# Patient Record
Sex: Male | Born: 1955 | Race: Asian | Hispanic: No | Marital: Married | Smoking: Former smoker
Health system: Southern US, Community
[De-identification: ages and names within clinical notes are randomized; demographics above are authoritative.]

## PROBLEM LIST (undated history)

## (undated) DIAGNOSIS — I10 Essential (primary) hypertension: Secondary | ICD-10-CM

## (undated) DIAGNOSIS — H9209 Otalgia, unspecified ear: Secondary | ICD-10-CM

## (undated) HISTORY — DX: Essential (primary) hypertension: I10

## (undated) HISTORY — DX: Otalgia, unspecified ear: H92.09

## (undated) HISTORY — PX: NO PAST SURGERIES: SHX2092

---

## 2015-04-17 ENCOUNTER — Ambulatory Visit (INDEPENDENT_AMBULATORY_CARE_PROVIDER_SITE_OTHER): Payer: BLUE CROSS/BLUE SHIELD | Admitting: Family Medicine

## 2015-04-17 ENCOUNTER — Encounter: Payer: Self-pay | Admitting: Family Medicine

## 2015-04-17 VITALS — BP 100/74 | HR 88 | Temp 97.7°F | Ht 65.0 in | Wt 160.6 lb

## 2015-04-17 DIAGNOSIS — Z7689 Persons encountering health services in other specified circumstances: Secondary | ICD-10-CM

## 2015-04-17 DIAGNOSIS — Z7189 Other specified counseling: Secondary | ICD-10-CM

## 2015-04-17 DIAGNOSIS — Z13220 Encounter for screening for lipoid disorders: Secondary | ICD-10-CM | POA: Diagnosis not present

## 2015-04-17 DIAGNOSIS — I10 Essential (primary) hypertension: Secondary | ICD-10-CM | POA: Diagnosis not present

## 2015-04-17 DIAGNOSIS — Z1211 Encounter for screening for malignant neoplasm of colon: Secondary | ICD-10-CM | POA: Diagnosis not present

## 2015-04-17 LAB — LIPID PANEL
CHOLESTEROL: 167 mg/dL (ref 0–200)
HDL: 42.7 mg/dL (ref >39.00)
LDL Cholesterol: 103 mg/dL — ABNORMAL HIGH (ref 0–99)
NONHDL: 124.45
Total CHOL/HDL Ratio: 4
Triglycerides: 106 mg/dL (ref 0.0–149.0)
VLDL: 21.2 mg/dL (ref 0.0–40.0)

## 2015-04-17 LAB — BASIC METABOLIC PANEL
BUN: 17 mg/dL (ref 6–23)
CO2: 32 mEq/L (ref 19–32)
Calcium: 9.7 mg/dL (ref 8.4–10.5)
Chloride: 103 mEq/L (ref 96–112)
Creatinine, Ser: 0.85 mg/dL (ref 0.40–1.50)
GFR: 98.38 mL/min (ref >60.00)
GLUCOSE: 79 mg/dL (ref 70–99)
Potassium: 4.1 mEq/L (ref 3.5–5.1)
SODIUM: 140 meq/L (ref 135–145)

## 2015-04-17 NOTE — Progress Notes (Signed)
HPI:  Joseph Wong is here to establish care. His wife come to this practice. Want to transfer here as is more convenient. Speaks [place]. Interpreter present.  Has the following chronic problems that require follow up and concerns today:  HTN: -chronic -meds: olmesartan-hctz 40-25 (1/2 tablet) daily - this is expensive for him but he prefers to continue with it -Denies: CP, SOB, DOE, HA, vision changes  Colon cancer screening: did colonoscopy in [place] 5 years ago and was told to repeat in  5 Years. He wants referral for this.  ROS negative for unless reported above: fevers, unintentional weight loss, hearing or vision loss, chest pain, palpitations, struggling to breath, hemoptysis, melena, hematochezia, hematuria, falls, loc, si, thoughts of self harm  Past Medical History  Diagnosis Date  . Hypertension   . Ear pain     sees ENT    History reviewed. No pertinent past surgical history.  Family History  Problem Relation Age of Onset  . Heart disease Father 92    MI  . Heart disease Brother     vulvular disease  . Hypertension Brother     History   Social History  . Marital Status: Married    Spouse Name: N/A  . Number of Children: N/A  . Years of Education: N/A   Social History Main Topics  . Smoking status: Former Games developer  . Smokeless tobacco: Not on file     Comment: 36-43 yo, 1ppd  . Alcohol Use: 0.0 oz/week    0 Standard drinks or equivalent per week     Comment: wine 2-3 glasses, twice per week  . Drug Use: No  . Sexual Activity: Not on file   Other Topics Concern  . None   Social History Narrative   Work or School: full Games developer Situation: lives with wife whom also sees me      Spiritual Beliefs: Christian      Lifestyle: goes to the gym 4x per week; trying to ear more veggies limiting           Current outpatient prescriptions:  .  olmesartan-hydrochlorothiazide (BENICAR HCT) 40-25 MG per tablet, Take 1 tablet by mouth daily.,  Disp: , Rfl:   EXAM:  Filed Vitals:   04/17/15 1131  BP: 100/74  Pulse: 88  Temp: 97.7 F (36.5 C)    Body mass index is 26.73 kg/(m^2).  GENERAL: vitals reviewed and listed above, alert, oriented, appears well hydrated and in no acute distress  HEENT: atraumatic, conjunttiva clear, no obvious abnormalities on inspection of external nose and ears  NECK: no obvious masses on inspection  LUNGS: clear to auscultation bilaterally, no wheezes, rales or rhonchi, good air movement  CV: HRRR, no peripheral edema  MS: moves all extremities without noticeable abnormality  PSYCH: pleasant and cooperative, no obvious depression or anxiety  ASSESSMENT AND PLAN:  > 30 minutes spent with this patient with > 50% face to face on counsleing: Discussed the following assessment and plan:  Essential hypertension - Plan: Basic metabolic panel  Screening for colon cancer - Plan: Ambulatory referral to Gastroenterology  Encounter to establish care  Screening for hyperlipidemia - Plan: Lipid Panel  -advised healthy lifestyle -risks of alcohol and safe drinking levels discussed -discussed options for his BP - many that would likely be cheaper, but he opted to continue current medication for no, advised to call if wants to change -We reviewed the PMH, PSH, FH, SH, Meds and  Allergies. -We provided refills for any medications we will prescribe as needed. -We addressed current concerns per orders and patient instructions. -We have asked for records for pertinent exams, studies, vaccines and notes from previous providers. -We have advised patient to follow up per instructions below.  -Patient advised to return or notify a doctor immediately if symptoms worsen or persist or new concerns arise.  Patient Instructions  BEFORE YOU LEAVE: -labs -schedule physical exam in 6 months  -We placed a referral for you as discussed to the gastroenterologist for your colonoscopy. It usually takes about 1-2  weeks to process and schedule this referral. If you have not heard from [place] regarding this appointment in 2 weeks please contact our office.  -We have ordered labs or studies at this visit. It can take up to 1-2 weeks for results and processing. We will contact you with instructions IF your results are abnormal. Normal results will be released to your [facility]. If you have not heard from [place] or can not find your results in [facility] in 2 weeks please contact our office.  We recommend the following healthy lifestyle measures: - eat a healthy diet consisting of lots of vegetables, fruits, beans, nuts, seeds, healthy meats such as white chicken and fish and whole grains.  - avoid fried foods, fast food, processed foods, sodas, red meet and other fattening foods.  - get a least 150 minutes of aerobic exercise per week.            Kriste Basque R.

## 2015-04-17 NOTE — Progress Notes (Signed)
Pre visit review using our clinic review tool, if applicable. No additional management support is needed unless otherwise documented below in the visit note. 

## 2015-04-17 NOTE — Patient Instructions (Signed)
BEFORE YOU LEAVE: -labs -schedule physical exam in 6 months  -We placed a referral for you as discussed to the gastroenterologist for your colonoscopy. It usually takes about 1-2 weeks to process and schedule this referral. If you have not heard from [place] regarding this appointment in 2 weeks please contact our office.  -We have ordered labs or studies at this visit. It can take up to 1-2 weeks for results and processing. We will contact you with instructions IF your results are abnormal. Normal results will be released to your [facility]. If you have not heard from [place] or can not find your results in [facility] in 2 weeks please contact our office.  We recommend the following healthy lifestyle measures: - eat a healthy diet consisting of lots of vegetables, fruits, beans, nuts, seeds, healthy meats such as white chicken and fish and whole grains.  - avoid fried foods, fast food, processed foods, sodas, red meet and other fattening foods.  - get a least 150 minutes of aerobic exercise per week.

## 2015-05-12 ENCOUNTER — Ambulatory Visit: Payer: Self-pay | Admitting: Family Medicine

## 2015-10-12 ENCOUNTER — Encounter: Payer: Self-pay | Admitting: Family Medicine

## 2015-10-13 ENCOUNTER — Encounter: Payer: Self-pay | Admitting: Family Medicine

## 2016-07-04 ENCOUNTER — Encounter: Payer: Self-pay | Admitting: *Deleted

## 2016-10-03 NOTE — Progress Notes (Signed)
HPI:  Here for CPE:  -Concerns and/or follow up today: none [place] language, interpreter present. Seen once in 2016, has not returned since. Hx HTN on acei-diuretic combo and mild HLD. Taking half pill. Needs refill.  -Diet: variety of foods, balance and well rounded, larger portion sizes - lots of rice  -Exercise: no regular exercise  -Diabetes and Dyslipidemia Screening:FASTING for labs  -Hx of HTN: no  -Vaccines: UTD  -sexual activity: yes, male partner, no new partners  -wants STI testing, Hep C screening (if born [id number]): desires hep c screening  -FH colon or prstate ca: see FH Last colon cancer screening: at last visit [place]x [place] with 5 year repeat advised. He requested referral and this was placed, but then when they contacted him to schedule he declined per phone note review.  [place] and [place] in [place]. Does not have report.  Last prostate ca screening: discussed limitation/risks/benefits/indication for DRE and/or PSA per current guidelines. He opted for PSA.  -Alcohol, Tobacco, drug use: see social history  Review of Systems - no fevers, unintentional weight loss, vision loss, hearing loss, chest pain, sob, hemoptysis, melena, hematochezia, hematuria, genital discharge, changing or concerning skin lesions, bleeding, bruising, loc, thoughts of self harm or SI  Past Medical History:  Diagnosis Date  . Ear pain    sees ENT  . Hypertension     No past surgical history on file.  Family History  Problem Relation Age of Onset  . Heart disease Father [id number]    MI  . Heart disease Brother     vulvular disease  . Hypertension Brother     Social History   Social History  . Marital status: Married    Spouse name: N/A  . Number of children: N/A  . Years of education: N/A   Social History Main Topics  . Smoking status: Former Games developermoker  . Smokeless tobacco: None     Comment: 896-43 yo,  1ppd  . Alcohol use 0.0 oz/week     Comment: wine 2-3 glasses, twice per week  . Drug use: No  . Sexual activity: Not Asked   Other Topics Concern  . None   Social History Narrative   Work or School: full Games developertime retail      Home Situation: lives with wife whom also sees me      Spiritual Beliefs: Christian      Lifestyle: goes to the gym 4x per week; trying to ear more veggies limiting           Current Outpatient Prescriptions:  .  olmesartan-hydrochlorothiazide (BENICAR HCT) 40-25 MG tablet, Take 0.5 tablets by mouth daily., Disp: 45 tablet, Rfl: 3  EXAM:  Vitals:   10/04/16 1104  BP: 104/80  Pulse: 80  Temp: 98 F (36.7 C)  TempSrc: Oral  Weight: 169 lb 9.6 oz (76.9 kg)  Height: 5' 5.25" (1.657 m)    Estimated body mass index is 28.01 kg/m as calculated from the following:   Height as of this encounter: 5' 5.25" (1.657 m).   Weight as of this encounter: 169 lb 9.6 oz (76.9 kg).  GENERAL: vitals reviewed and listed below, alert, oriented, appears well hydrated and in no acute distress  HEENT: head atraumatic, PERRLA, normal appearance of eyes, ears, nose and mouth. moist mucus membranes.  NECK: supple, no masses or lymphadenopathy  LUNGS: clear to auscultation bilaterally, no rales, rhonchi or wheeze  CV: HRRR, no  peripheral edema or cyanosis, normal pedal pulses  ABDOMEN: bowel sounds normal, soft, non tender to palpation, no masses, no rebound or guarding  GU: normal appearance of external genitalia - no lesions or masses, hernia exam normal.  RECTAL: refused  SKIN: no rash or abnormal lesions  MS: normal gait, moves all extremities normally  NEURO: normal gait, speech and thought processing grossly intact, muscle tone grossly intact throughout  PSYCH: normal affect, pleasant and cooperative  ASSESSMENT AND PLAN:  Discussed the following assessment and plan:  There are no diagnoses linked to this encounter.  -Discussed and advised all [place]  preventive services health task force level A and B recommendations for age, sex and risks.  --Advised at least 150 minutes of exercise per week and a healthy diet with avoidance of (less then 1 serving per week) processed foods, white starches, red meat, fast foods and sweets and consisting of: * 5-9 servings of fresh fruits and vegetables (not corn or potatoes) *nuts and seeds, beans *olives and olive oil *lean meats such as fish and white chicken  *whole grains  -labs, studies and vaccines per orders this encounter   Patient advised to return to clinic immediately if symptoms worsen or persist or new concerns.  Patient Instructions  BEFORE YOU LEAVE: -labs -follow up: 3-4 months  We have ordered labs or studies at this visit. It can take up to 1-2 weeks for results and processing. IF results require follow up or explanation, we will call you with instructions. Clinically stable results will be released to your [facility]. If you have not heard from [place] or cannot find your results in [facility] in 2 weeks please contact our office at [phone].  If you are not yet signed up for [facility], please consider signing up.  Please bring a copy of your prior colonoscopy reports to our office.  We recommend the following healthy lifestyle for LIFE: 1) Small portions.   Tip: eat off of a salad plate instead of a dinner plate..  Tip: if you need more or a snack choose fruits, veggies and/or a handful of nuts or seeds.  2) Eat a healthy clean diet.  * Tip: Avoid (less then 1 serving per week): processed foods, sweets, sweetened drinks, white starches (rice, flour, bread, potatoes, pasta, etc), red meat, fast foods, butter  *Tip: CHOOSE instead   * 5-9 servings per day of fresh or frozen fruits and vegetables (but not corn, potatoes, bananas, canned or dried fruit)   *nuts and seeds, beans   *olives and olive oil   *small portions of lean meats such as fish and white chicken    *small portions of  whole grains  3)Get at least 150 minutes of sweaty aerobic exercise per week.  4)Reduce stress - consider counseling, meditation and relaxation to balance other aspects of your life.            No Follow-up on file.   Kriste Basque R., DO

## 2016-10-04 ENCOUNTER — Ambulatory Visit (INDEPENDENT_AMBULATORY_CARE_PROVIDER_SITE_OTHER): Payer: BLUE CROSS/BLUE SHIELD | Admitting: Family Medicine

## 2016-10-04 ENCOUNTER — Encounter: Payer: Self-pay | Admitting: Family Medicine

## 2016-10-04 VITALS — BP 104/80 | HR 80 | Temp 98.0°F | Ht 65.25 in | Wt 169.6 lb

## 2016-10-04 DIAGNOSIS — Z0000 Encounter for general adult medical examination without abnormal findings: Secondary | ICD-10-CM

## 2016-10-04 DIAGNOSIS — Z6828 Body mass index (BMI) 28.0-28.9, adult: Secondary | ICD-10-CM

## 2016-10-04 DIAGNOSIS — I10 Essential (primary) hypertension: Secondary | ICD-10-CM

## 2016-10-04 LAB — BASIC METABOLIC PANEL
BUN: 21 mg/dL (ref 6–23)
CALCIUM: 9.5 mg/dL (ref 8.4–10.5)
CO2: 26 mEq/L (ref 19–32)
CREATININE: 0.78 mg/dL (ref 0.40–1.50)
Chloride: 106 mEq/L (ref 96–112)
GFR: 108.09 mL/min (ref >60.00)
Glucose, Bld: 89 mg/dL (ref 70–99)
Potassium: 3.7 mEq/L (ref 3.5–5.1)
Sodium: 140 mEq/L (ref 135–145)

## 2016-10-04 LAB — LIPID PANEL
CHOLESTEROL: 184 mg/dL (ref 0–200)
HDL: 47.3 mg/dL (ref >39.00)
LDL Cholesterol: 126 mg/dL — ABNORMAL HIGH (ref 0–99)
NONHDL: 137.14
Total CHOL/HDL Ratio: 4
Triglycerides: 56 mg/dL (ref 0.0–149.0)
VLDL: 11.2 mg/dL (ref 0.0–40.0)

## 2016-10-04 LAB — CBC
HCT: 44.7 % (ref 39.0–52.0)
HEMOGLOBIN: 15.6 g/dL (ref 13.0–17.0)
MCHC: 34.8 g/dL (ref 30.0–36.0)
MCV: 85.6 fl (ref 78.0–100.0)
PLATELETS: 285 10*3/uL (ref 150.0–400.0)
RBC: 5.22 Mil/uL (ref 4.22–5.81)
RDW: 13.3 % (ref 11.5–15.5)
WBC: 5.8 10*3/uL (ref 4.0–10.5)

## 2016-10-04 LAB — PSA: PSA: 0.38 ng/mL (ref 0.10–4.00)

## 2016-10-04 LAB — HEMOGLOBIN A1C: Hgb A1c MFr Bld: 5.3 % (ref 4.6–6.5)

## 2016-10-04 MED ORDER — OLMESARTAN MEDOXOMIL-HCTZ 40-25 MG PO TABS: 0.5000 | ORAL_TABLET | Freq: Every day | ORAL | 3 refills | Status: DC

## 2016-10-04 NOTE — Progress Notes (Signed)
Pre visit review using our clinic review tool, if applicable. No additional management support is needed unless otherwise documented below in the visit note. 

## 2016-10-04 NOTE — Patient Instructions (Addendum)
BEFORE YOU LEAVE: -labs -follow up: 3-4 months  We have ordered labs or studies at this visit. It can take up to 1-2 weeks for results and processing. IF results require follow up or explanation, we will call you with instructions. Clinically stable results will be released to your [facility]. If you have not heard from us or cannot find your results in [facility] in 2 weeks please contact our office at [phone].  If you are not yet signed up for [facility], please consider signing up.  Please bring a copy of your prior colonoscopy reports to our office.  We recommend the following healthy lifestyle for LIFE: 1) Small portions.   Tip: eat off of a salad plate instead of a dinner plate..  Tip: if you need more or a snack choose fruits, veggies and/or a handful of nuts or seeds.  2) Eat a healthy clean diet.  * Tip: Avoid (less then 1 serving per week): processed foods, sweets, sweetened drinks, white starches (rice, flour, bread, potatoes, pasta, etc), red meat, fast foods, butter  *Tip: CHOOSE instead   * 5-9 servings per day of fresh or frozen fruits and vegetables (but not corn, potatoes, bananas, canned or dried fruit)   *nuts and seeds, beans   *olives and olive oil   *small portions of lean meats such as fish and white chicken    *small portions of whole grains  3)Get at least 150 minutes of sweaty aerobic exercise per week.  4)Reduce stress - consider counseling, meditation and relaxation to balance other aspects of your life.

## 2016-10-05 LAB — HEPATITIS C ANTIBODY: HCV Ab: NEGATIVE

## 2017-01-16 ENCOUNTER — Ambulatory Visit (INDEPENDENT_AMBULATORY_CARE_PROVIDER_SITE_OTHER): Payer: BLUE CROSS/BLUE SHIELD | Admitting: Family Medicine

## 2017-01-16 ENCOUNTER — Encounter: Payer: Self-pay | Admitting: Family Medicine

## 2017-01-16 VITALS — BP 120/90 | HR 79 | Temp 98.3°F | Ht 65.25 in | Wt 165.2 lb

## 2017-01-16 DIAGNOSIS — I10 Essential (primary) hypertension: Secondary | ICD-10-CM | POA: Diagnosis not present

## 2017-01-16 DIAGNOSIS — E785 Hyperlipidemia, unspecified: Secondary | ICD-10-CM | POA: Diagnosis not present

## 2017-01-16 DIAGNOSIS — M5442 Lumbago with sciatica, left side: Secondary | ICD-10-CM | POA: Diagnosis not present

## 2017-01-16 LAB — BASIC METABOLIC PANEL
BUN: 21 mg/dL (ref 6–23)
CALCIUM: 9.2 mg/dL (ref 8.4–10.5)
CHLORIDE: 103 meq/L (ref 96–112)
CO2: 27 mEq/L (ref 19–32)
Creatinine, Ser: 0.83 mg/dL (ref 0.40–1.50)
GFR: 100.51 mL/min (ref >60.00)
Glucose, Bld: 85 mg/dL (ref 70–99)
Potassium: 3.3 mEq/L — ABNORMAL LOW (ref 3.5–5.1)
SODIUM: 138 meq/L (ref 135–145)

## 2017-01-16 LAB — CBC
HCT: 42.2 % (ref 39.0–52.0)
Hemoglobin: 14.5 g/dL (ref 13.0–17.0)
MCHC: 34.3 g/dL (ref 30.0–36.0)
MCV: 88 fl (ref 78.0–100.0)
Platelets: 245 10*3/uL (ref 150.0–400.0)
RBC: 4.79 Mil/uL (ref 4.22–5.81)
RDW: 13.2 % (ref 11.5–15.5)
WBC: 6.2 10*3/uL (ref 4.0–10.5)

## 2017-01-16 MED ORDER — CYCLOBENZAPRINE HCL 5 MG PO TABS: 5.0000 mg | ORAL_TABLET | Freq: Every evening | ORAL | 0 refills | Status: DC | PRN

## 2017-01-16 MED ORDER — OLMESARTAN MEDOXOMIL-HCTZ 40-25 MG PO TABS: 1.0000 | ORAL_TABLET | Freq: Every day | ORAL | 3 refills | Status: DC

## 2017-01-16 NOTE — Patient Instructions (Addendum)
BEFORE YOU LEAVE: -HEP low back -labs -follow up: 1 month  Increase the blood pressure medication (benicar) to 1 tablet daily.  Do the back exercises 4 days per week.  Muscle relaxer at night as needed.  Can use heat, topical menthol (tiger balm) and tylenol per instructions if needed for pain.  We have ordered labs or studies at this visit. It can take up to 1-2 weeks for results and processing. IF results require follow up or explanation, we will call you with instructions. Clinically stable results will be released to your [facility]. If you have not heard from [place] or cannot find your results in [facility] in 2 weeks please contact our office at [phone].  If you are not yet signed up for [facility], please consider signing up.   We recommend the following healthy lifestyle for LIFE: 1) Small portions.   Tip: eat off of a salad plate instead of a dinner plate.  Tip: if you need more or a snack choose fruits, veggies and/or a handful of nuts or seeds.  2) Eat a healthy clean diet.  * Tip: Avoid (less then 1 serving per week): processed foods, sweets, sweetened drinks, white starches (rice, flour, bread, potatoes, pasta, etc), red meat, fast foods, butter  *Tip: CHOOSE instead   * 5-9 servings per day of fresh or frozen fruits and vegetables (but not corn, potatoes, bananas, canned or dried fruit)   *nuts and seeds, beans   *olives and olive oil   *small portions of lean meats such as fish and white chicken    *small portions of whole grains  3)Get at least 150 minutes of sweaty aerobic exercise per week.  4)Reduce stress - consider counseling, meditation and relaxation to balance other aspects of your life.

## 2017-01-16 NOTE — Progress Notes (Signed)
Pre visit review using our clinic review tool, if applicable. No additional management support is needed unless otherwise documented below in the visit note. 

## 2017-01-16 NOTE — Progress Notes (Signed)
HPI:  Follow up: Due for labs.  New concern Low back pain, -bilat, intermittent for > 14 years -most recent flare 1 month ago and had bilat low back pain, with some radiation pain to L post leg -he saw chiropractor and now is doing much better but still has some bilat low back pain in muscles -at this point denies: radiation, weakness, numbness, bowel or bladder incontinence, fevers, malaise  HTN: -meds: olmesartan-hctz 1/2 tab daily -running high at home  HLD: -mild, lifestyle recs advised -swimming, diet so so  ROS: See pertinent positives and negatives per HPI.  Past Medical History:  Diagnosis Date  . Ear pain    sees ENT  . Hypertension     No past surgical history on file.  Family History  Problem Relation Age of Onset  . Heart disease Father 63    MI  . Heart disease Brother     vulvular disease  . Hypertension Brother     Social History   Social History  . Marital status: Married    Spouse name: N/A  . Number of children: N/A  . Years of education: N/A   Social History Main Topics  . Smoking status: Former Games developer  . Smokeless tobacco: Never Used     Comment: 45-43 yo, 1ppd  . Alcohol use 0.0 oz/week     Comment: wine 2-3 glasses, twice per week  . Drug use: No  . Sexual activity: Not Asked   Other Topics Concern  . 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  olmesartan-hydrochlorothiazide (BENICAR HCT) 40-25 MG tablet, Take 1 tablet by mouth daily., Disp: 90 tablet, Rfl: 3 .  cyclobenzaprine (FLEXERIL) 5 MG tablet, Take 1 tablet (5 mg total) by mouth at bedtime as needed for muscle spasms., Disp: 20 tablet, Rfl: 0  EXAM:  Vitals:   01/16/17 1259 01/16/17 1302  BP: 100/80 120/90  Pulse: 79    Temp: 98.3 F (36.8 C)     Body mass index is 27.28 kg/m.  GENERAL: vitals reviewed and listed above, alert, oriented, appears well hydrated and in no acute distress  HEENT: atraumatic, conjunttiva clear, no obvious abnormalities on inspection of external nose and ears  NECK: no obvious masses on inspection  LUNGS: clear to auscultation bilaterally, no wheezes, rales or rhonchi, good air movement  CV: HRRR, no peripheral edema  MS: moves all extremities without noticeable abnormality Normal Gait Normal inspection of back, no obvious scoliosis or leg length descrepancy No bony TTP Soft tissue TTP at: -/+ tests: neg trendelenburg,-facet loading, -SLRT, -CLRT, -FABER, -FADIR Normal muscle strength, sensation to light touch and DTRs in LEs bilaterally  PSYCH: pleasant and cooperative, no obvious depression or anxiety  ASSESSMENT AND PLAN:  Discussed the following assessment and plan:  Essential hypertension - Plan: Basic metabolic panel, CBC  Hyperlipidemia, unspecified hyperlipidemia type  Left-sided low back pain with left-sided sciatica, unspecified chronicity  -labs -increase BP med -etiologies, eval, tx back pain discussed - opted for HEP, muscle relaxer, close follow up and per pt instructions -follow up 1 month -Patient advised to return or notify a doctor immediately if symptoms  worsen or persist or new concerns arise.  Patient Instructions  BEFORE YOU LEAVE: -HEP low back -labs -follow up: 1 month  Increase the blood pressure medication (benicar) to 1 tablet daily.  Do the back exercises 4 days per week.  Muscle relaxer at night as needed.  Can use heat, topical menthol (tiger balm) and tylenol per instructions if needed for pain.  We have ordered labs or studies at this visit. It can take up to 1-2 weeks for results and processing. IF results require follow up or explanation, we will call you with instructions. Clinically stable results will be  released to your [facility]. If you have not heard from [place] or cannot find your results in [facility] in 2 weeks please contact our office at [phone].  If you are not yet signed up for [facility], please consider signing up.   We recommend the following healthy lifestyle for LIFE: 1) Small portions.   Tip: eat off of a salad plate instead of a dinner plate.  Tip: if you need more or a snack choose fruits, veggies and/or a handful of nuts or seeds.  2) Eat a healthy clean diet.  * Tip: Avoid (less then 1 serving per week): processed foods, sweets, sweetened drinks, white starches (rice, flour, bread, potatoes, pasta, etc), red meat, fast foods, butter  *Tip: CHOOSE instead   * 5-9 servings per day of fresh or frozen fruits and vegetables (but not corn, potatoes, bananas, canned or dried fruit)   *nuts and seeds, beans   *olives and olive oil   *small portions of lean meats such as fish and white chicken    *small portions of whole grains  3)Get at least 150 minutes of sweaty aerobic exercise per week.  4)Reduce stress - consider counseling, meditation and relaxation to balance other aspects of your life.         Kriste Basque R., DO

## 2017-01-18 ENCOUNTER — Other Ambulatory Visit: Payer: Self-pay | Admitting: *Deleted

## 2017-01-18 DIAGNOSIS — E876 Hypokalemia: Secondary | ICD-10-CM

## 2017-02-06 ENCOUNTER — Ambulatory Visit: Payer: BLUE CROSS/BLUE SHIELD | Admitting: Family Medicine

## 2017-03-06 NOTE — Progress Notes (Signed)
HPI:  Follow up:   HTN: -meds: olmesartan-hctz increased last visit -reports tolerating new dose well -mild hypokalemia last check -no cp, sob, doe, ha  HLD: -mild, lifestyle recs advised -swimming, diet so so -mildly elevated last check and he inquires about labs  Flare chronic low back pain: -treated with HEP, muscle relaxer and 1 month f/u advised last visit -reports almost complete resolution symptoms  ROS: See pertinent positives and negatives per HPI.  Past Medical History:  Diagnosis Date  . Ear pain    sees ENT  . Hypertension     No past surgical history on file.  Family History  Problem Relation Age of Onset  . Heart disease Father 2167       MI  . Heart disease Brother        vulvular disease  . Hypertension Brother     Social History   Social History  . Marital status: Married    Spouse name: N/A  . Number of children: N/A  . Years of education: N/A   Social History Main Topics  . Smoking status: Former Games developermoker  . Smokeless tobacco: Never Used     Comment: 5726-61 yo, 1ppd  . Alcohol use 0.0 oz/week     Comment: wine 2-3 glasses, twice per week, twice per week  . Drug use: No  . Sexual activity: Not Asked   Other Topics Concern  . None   Social History Narrative   Work or School: full Games developertime retail      Home Situation: lives with wife whom also sees me      Spiritual Beliefs: Christian      Lifestyle: goes to the gym 4x per week; trying to ear more veggies limiting           Current Outpatient Prescriptions:  .  olmesartan-hydrochlorothiazide (BENICAR HCT) 40-25 MG tablet, Take 1 tablet by mouth daily., Disp: 90 tablet, Rfl: 3 .  cyclobenzaprine (FLEXERIL) 5 MG tablet, Take 1 tablet (5 mg total) by mouth at bedtime as needed for muscle spasms. (Patient not taking: Reported on 03/07/2017), Disp: 20 tablet, Rfl: 0  EXAM:  Vitals:   03/07/17 1012  BP: 108/70  Pulse: 72  Temp: 98.1 F (36.7 C)    Body mass index is 27.69 kg/m.  GENERAL:  vitals reviewed and listed above, alert, oriented, appears well hydrated and in no acute distress  HEENT: atraumatic, conjunttiva clear, no obvious abnormalities on inspection of external nose and ears  NECK: no obvious masses on inspection  LUNGS: clear to auscultation bilaterally, no wheezes, rales or rhonchi, good air movement  CV: HRRR, no peripheral edema  MS: moves all extremities without noticeable abnormality  PSYCH: pleasant and cooperative, no obvious depression or anxiety  ASSESSMENT AND PLAN:  Discussed the following assessment and plan:  Essential hypertension  Hypokalemia  Hyperlipidemia, unspecified hyperlipidemia type  Acute low back pain, unspecified back pain laterality, with sciatica presence unspecified  Low blood potassium - Plan: Basic metabolic panel  -check BMP  -lifestyle recs -continue HEP to keep back strong -printed and explanation labs -Patient advised to return or notify a doctor immediately if symptoms worsen or persist or new concerns arise.  Patient Instructions  BEFORE YOU LEAVE: -lab to recheck potassium -please print out last BMP, last hgba1c and last cholesterol results for patient - he is not due for any other labs this visit but will plan to check blood pressure labs next visit. Cholesterol and diabetes screening labs are recommended every 1-3 years  depending on results. -follow up: 3-4 months  Continue the back exercises 4 days per week. I am so glad you are feeling better.  Advise regular aerobic exercise (at least 150 minutes per week of sweaty exercise) and a healthy diet. Try to eat at least 5-9 servings of vegetables and fruits per day (not corn, potatoes or bananas.) Avoid sweets, red meat, pork, butter, fried foods, fast food, processed food, excessive dairy, eggs and coconut. Replace bad fats with good fats - fish, nuts and seeds, canola oil, olive oil.      Kriste Basque R., DO

## 2017-03-07 ENCOUNTER — Ambulatory Visit (INDEPENDENT_AMBULATORY_CARE_PROVIDER_SITE_OTHER): Payer: BLUE CROSS/BLUE SHIELD | Admitting: Family Medicine

## 2017-03-07 ENCOUNTER — Encounter: Payer: Self-pay | Admitting: Family Medicine

## 2017-03-07 VITALS — BP 108/70 | HR 72 | Temp 98.1°F | Wt 167.7 lb

## 2017-03-07 DIAGNOSIS — I10 Essential (primary) hypertension: Secondary | ICD-10-CM | POA: Diagnosis not present

## 2017-03-07 DIAGNOSIS — E785 Hyperlipidemia, unspecified: Secondary | ICD-10-CM

## 2017-03-07 DIAGNOSIS — M545 Low back pain, unspecified: Secondary | ICD-10-CM | POA: Insufficient documentation

## 2017-03-07 DIAGNOSIS — E876 Hypokalemia: Secondary | ICD-10-CM | POA: Diagnosis not present

## 2017-03-07 LAB — BASIC METABOLIC PANEL
BUN: 23 mg/dL (ref 6–23)
CALCIUM: 9.5 mg/dL (ref 8.4–10.5)
CO2: 26 meq/L (ref 19–32)
CREATININE: 0.72 mg/dL (ref 0.40–1.50)
Chloride: 105 mEq/L (ref 96–112)
GFR: 118.38 mL/min (ref >60.00)
Glucose, Bld: 99 mg/dL (ref 70–99)
Potassium: 3.6 mEq/L (ref 3.5–5.1)
Sodium: 138 mEq/L (ref 135–145)

## 2017-03-07 NOTE — Patient Instructions (Addendum)
BEFORE YOU LEAVE: -lab to recheck potassium -please print out last BMP, last hgba1c and last cholesterol results for patient - he is not due for any other labs this visit but will plan to check blood pressure labs next visit. Cholesterol and diabetes screening labs are recommended every 1-3 years depending on results. -follow up: 3-4 months  Continue the back exercises 4 days per week. I am so glad you are feeling better.  Advise regular aerobic exercise (at least 150 minutes per week of sweaty exercise) and a healthy diet. Try to eat at least 5-9 servings of vegetables and fruits per day (not corn, potatoes or bananas.) Avoid sweets, red meat, pork, butter, fried foods, fast food, processed food, excessive dairy, eggs and coconut. Replace bad fats with good fats - fish, nuts and seeds, canola oil, olive oil.

## 2017-06-04 ENCOUNTER — Encounter: Payer: Self-pay | Admitting: *Deleted

## 2017-06-04 DIAGNOSIS — Z23 Encounter for immunization: Secondary | ICD-10-CM

## 2017-06-04 NOTE — Progress Notes (Signed)
HPI:  Joseph Wong is a pleasant 61 y.o. here for follow up. Chronic medical problems summarized below were reviewed for changes. Doing okay for the most part. He reports he thinks he needs to see an ophthalmologist though, as he has had several episodes of a visual disturbance. He reports the last episode was about 3-4 months ago. He occasionally will see a flash or light in his left eye and then feels like his vision is distorted and in the left eye briefly. This has not occurred a long time, but he feels occurs several times per year and has been ongoing for several years. Denies CP, SOB, DOE, treatment intolerance or new symptoms. Due for flu shot and labs.  HTN: -meds: olmesartan-hctz   HLD: -mild, lifestyle recs advised -swimming, diet improved per his report   ROS: See pertinent positives and negatives per HPI.  Past Medical History:  Diagnosis Date  . Ear pain    sees ENT  . Hypertension     No past surgical history on file.  Family History  Problem Relation Age of Onset  . Heart disease Father 68       MI  . Heart disease Brother        vulvular disease  . Hypertension Brother     Social History   Social History  . Marital status: Married    Spouse name: N/A  . Number of children: N/A  . Years of education: N/A   Social History Main Topics  . Smoking status: Former Games developer  . Smokeless tobacco: Never Used     Comment: 6-43 yo, 1ppd  . Alcohol use 0.0 oz/week     Comment: wine 2-3 glasses, twice per week  . Drug use: No  . Sexual activity: Not Asked   Other Topics Concern  . 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  olmesartan-hydrochlorothiazide (BENICAR HCT) 40-25 MG tablet, Take 1 tablet by mouth  daily., Disp: 90 tablet, Rfl: 3  EXAM:  Vitals:   06/05/17 0947  BP: 98/78  Pulse: 79  Temp: 98.1 F (36.7 C)    Body mass index is 28.55 kg/m.  GENERAL: vitals reviewed and listed above, alert, oriented, appears well hydrated and in no acute distress  HEENT: atraumatic, conjunttiva clear, no obvious abnormalities on inspection of external nose and ears  NECK: no obvious masses on inspection  LUNGS: clear to auscultation bilaterally, no wheezes, rales or rhonchi, good air movement  CV: HRRR, no peripheral edema  MS: moves all extremities without noticeable abnormality  PSYCH: pleasant and cooperative, no obvious depression or anxiety  ASSESSMENT AND PLAN:  Discussed the following assessment and plan:  Vision disturbance - Plan: Ambulatory referral to Ophthalmology  Essential hypertension - Plan: Basic metabolic panel, CBC, Basic metabolic panel, CBC  Hyperlipidemia, unspecified hyperlipidemia type  -lifestyle recommendations -Referral to ophthalmology for his complaint of intermittent visual disturbance -Continue current medications -Flu shot today -He also asked about the shingles vaccine and will check with his insurance company on coverage -Patient advised to return or notify a doctor immediately if symptoms worsen or persist or new concerns arise.  Patient Instructions  BEFORE YOU LEAVE: -follow up: CPE in January - after 16th; come fasting -labs -call your insurance company to check on coverage for the Shingrix and Zostavax vaccines   -We placed a referral for you as discussed to the eye doctor. It usually takes about 1-2 weeks to process and schedule this referral. If you have not heard from [place] regarding this appointment in 2 weeks please contact our office. Seek emergency care if vision loss in the interim.  We have ordered labs or studies at this visit. It can take up to 1-2 weeks for results and processing. IF results require follow up or explanation,  we will call you with instructions. Clinically stable results will be released to your [facility]. If you have not heard from [place] or cannot find your results in [facility] in 2 weeks please contact our office at [phone].  If you are not yet signed up for [facility], please consider signing up.  Advise regular aerobic exercise (at least 150 minutes per week of sweaty exercise) and a healthy diet. Try to eat at least 5-9 servings of vegetables and fruits per day (not corn, potatoes or bananas.) Avoid sweets, red meat, pork, butter, fried foods, fast food, processed food, excessive dairy, eggs and coconut. Replace bad fats with good fats - fish, nuts and seeds, canola oil, olive oil.   WE NOW OFFER    [facility] FAST TRACK!!!  SAME DAY Appointments for ACUTE CARE  Such as: Sprains, Injuries, cuts, abrasions, rashes, muscle pain, joint pain, back pain Colds, flu, sore throats, headache, allergies, cough, fever  Ear pain, sinus and eye infections Abdominal pain, nausea, vomiting, diarrhea, upset stomach Animal/insect bites  3 Easy Ways to Schedule: Walk-In Scheduling Call in scheduling Mychart Sign-up: [email or web address]                   Kriste Basque R., DO

## 2017-06-05 ENCOUNTER — Ambulatory Visit (INDEPENDENT_AMBULATORY_CARE_PROVIDER_SITE_OTHER): Payer: BLUE CROSS/BLUE SHIELD | Admitting: Family Medicine

## 2017-06-05 ENCOUNTER — Encounter: Payer: Self-pay | Admitting: Family Medicine

## 2017-06-05 VITALS — BP 98/78 | HR 79 | Temp 98.1°F | Ht 65.25 in | Wt 172.9 lb

## 2017-06-05 DIAGNOSIS — I10 Essential (primary) hypertension: Secondary | ICD-10-CM

## 2017-06-05 DIAGNOSIS — H539 Unspecified visual disturbance: Secondary | ICD-10-CM | POA: Diagnosis not present

## 2017-06-05 DIAGNOSIS — E785 Hyperlipidemia, unspecified: Secondary | ICD-10-CM | POA: Diagnosis not present

## 2017-06-05 LAB — CBC
HEMATOCRIT: 44.7 % (ref 39.0–52.0)
HEMOGLOBIN: 15.1 g/dL (ref 13.0–17.0)
MCHC: 33.8 g/dL (ref 30.0–36.0)
MCV: 88.7 fl (ref 78.0–100.0)
PLATELETS: 247 10*3/uL (ref 150.0–400.0)
RBC: 5.04 Mil/uL (ref 4.22–5.81)
RDW: 13.1 % (ref 11.5–15.5)
WBC: 5.1 10*3/uL (ref 4.0–10.5)

## 2017-06-05 LAB — BASIC METABOLIC PANEL
BUN: 24 mg/dL — ABNORMAL HIGH (ref 6–23)
CALCIUM: 9.3 mg/dL (ref 8.4–10.5)
CHLORIDE: 104 meq/L (ref 96–112)
CO2: 26 meq/L (ref 19–32)
CREATININE: 0.82 mg/dL (ref 0.40–1.50)
GFR: 101.8 mL/min (ref >60.00)
GLUCOSE: 87 mg/dL (ref 70–99)
Potassium: 3.9 mEq/L (ref 3.5–5.1)
Sodium: 136 mEq/L (ref 135–145)

## 2017-06-05 NOTE — Patient Instructions (Addendum)
BEFORE YOU LEAVE: -follow up: CPE in January - after 16th; come fasting -labs -call your insurance company to check on coverage for the Shingrix and Zostavax vaccines   -We placed a referral for you as discussed to the eye doctor. It usually takes about 1-2 weeks to process and schedule this referral. If you have not heard from [place] regarding this appointment in 2 weeks please contact our office. Seek emergency care if vision loss in the interim.  We have ordered labs or studies at this visit. It can take up to 1-2 weeks for results and processing. IF results require follow up or explanation, we will call you with instructions. Clinically stable results will be released to your [facility]. If you have not heard from [place] or cannot find your results in [facility] in 2 weeks please contact our office at [phone].  If you are not yet signed up for [facility], please consider signing up.  Advise regular aerobic exercise (at least 150 minutes per week of sweaty exercise) and a healthy diet. Try to eat at least 5-9 servings of vegetables and fruits per day (not corn, potatoes or bananas.) Avoid sweets, red meat, pork, butter, fried foods, fast food, processed food, excessive dairy, eggs and coconut. Replace bad fats with good fats - fish, nuts and seeds, canola oil, olive oil.   WE NOW OFFER   [facility] FAST TRACK!!!  SAME DAY Appointments for ACUTE CARE  Such as: Sprains, Injuries, cuts, abrasions, rashes, muscle pain, joint pain, back pain Colds, flu, sore throats, headache, allergies, cough, fever  Ear pain, sinus and eye infections Abdominal pain, nausea, vomiting, diarrhea, upset stomach Animal/insect bites  3 Easy Ways to Schedule: Walk-In Scheduling Call in scheduling Mychart Sign-up: [email or web address]

## 2017-10-10 ENCOUNTER — Encounter: Payer: BLUE CROSS/BLUE SHIELD | Admitting: Family Medicine

## 2018-01-30 LAB — BASIC METABOLIC PANEL
BUN: 24 — AB (ref 4–21)
Creatinine: 1.2 (ref <=1.3)
Glucose: 87
Potassium: 4 (ref 3.4–5.3)
Sodium: 140 (ref 137–147)

## 2018-01-30 LAB — CBC AND DIFFERENTIAL
HCT: 51 (ref 41–53)
Hemoglobin: 17.4 (ref 13.5–17.5)
Platelets: 277 (ref 150–399)
WBC: 5.7

## 2018-01-30 LAB — IRON,TIBC AND FERRITIN PANEL: Ferritin: 192.56

## 2018-01-30 LAB — HEPATIC FUNCTION PANEL
ALT: 29 (ref 10–40)
AST: 37 (ref 14–40)
Alkaline Phosphatase: 86 (ref 25–125)
Bilirubin, Total: 0.8

## 2018-01-30 LAB — LIPID PANEL
Cholesterol: 197 (ref 0–200)
HDL: 52 (ref 35–70)
LDL Cholesterol: 133
Triglycerides: 62 (ref 40–160)

## 2018-01-30 LAB — HM DEXA SCAN: HM Dexa Scan: NORMAL

## 2018-01-30 LAB — HM COLONOSCOPY

## 2018-06-26 ENCOUNTER — Encounter: Payer: Self-pay | Admitting: *Deleted

## 2018-09-29 NOTE — Progress Notes (Deleted)
  HPI:  Using dictation device. Unfortunately this device frequently misinterprets words/phrases.  Rivka Saferan Gon Leonardo is a pleasant 63 y.o. here for follow up. Chronic medical problems summarized below were reviewed for changes and stability and were updated as needed below. These issues and their treatment remain stable for the most part.  We placed a referral to ophthalmology at his last visit and advised he call us in 1-2 weeks if not set up there for evaluation of intermittent visual disturbance for several years. ***. Denies CP, SOB, DOE, treatment intolerance or new symptoms. Due for fasting labs and CPE next visit   ROS: See pertinent positives and negatives per HPI.  Past Medical History:  Diagnosis Date  . Ear pain    sees ENT  . Hypertension     No past surgical history on file.  Family History  Problem Relation Age of Onset  . Heart disease Father 267       MI  . Heart disease Brother        vulvular disease  . Hypertension Brother     SOCIAL HX: ***   Current Outpatient Medications:  .  olmesartan-hydrochlorothiazide (BENICAR HCT) 40-25 MG tablet, Take 1 tablet by mouth daily., Disp: 90 tablet, Rfl: 3  EXAM:  There were no vitals filed for this visit.  There is no height or weight on file to calculate BMI.  GENERAL: vitals reviewed and listed above, alert, oriented, appears well hydrated and in no acute distress  HEENT: atraumatic, conjunttiva clear, no obvious abnormalities on inspection of external nose and ears  NECK: no obvious masses on inspection  LUNGS: clear to auscultation bilaterally, no wheezes, rales or rhonchi, good air movement  CV: HRRR, no peripheral edema  MS: moves all extremities without noticeable abnormality *** PSYCH: pleasant and cooperative, no obvious depression or anxiety  ASSESSMENT AND PLAN:  Discussed the following assessment and plan:  No diagnosis found.  *** -Patient advised to return or notify a doctor immediately if  symptoms worsen or persist or new concerns arise.  There are no Patient Instructions on file for this visit.  Terressa KoyanagiHannah R Eban Weick, DO

## 2018-10-01 ENCOUNTER — Ambulatory Visit: Payer: BLUE CROSS/BLUE SHIELD | Admitting: Family Medicine

## 2018-10-15 ENCOUNTER — Encounter: Payer: Self-pay | Admitting: Family Medicine

## 2018-10-15 ENCOUNTER — Ambulatory Visit (INDEPENDENT_AMBULATORY_CARE_PROVIDER_SITE_OTHER): Payer: Self-pay | Admitting: Family Medicine

## 2018-10-15 VITALS — BP 108/80 | HR 86 | Temp 98.0°F | Ht 65.25 in | Wt 172.8 lb

## 2018-10-15 DIAGNOSIS — I10 Essential (primary) hypertension: Secondary | ICD-10-CM

## 2018-10-15 DIAGNOSIS — E785 Hyperlipidemia, unspecified: Secondary | ICD-10-CM

## 2018-10-15 MED ORDER — OLMESARTAN MEDOXOMIL-HCTZ 40-25 MG PO TABS
1.0000 | ORAL_TABLET | Freq: Every day | ORAL | 3 refills | Status: DC
Start: 2018-10-15 — End: 2019-06-24

## 2018-10-15 NOTE — Progress Notes (Signed)
HPI:  Using dictation device. Unfortunately this device frequently misinterprets words/phrases.  Joseph Wong is a pleasant 63 y.o. here for follow up. Chronic medical problems summarized below were reviewed for changes and stability and were updated as needed below. These issues and their treatment remain stable for the most part.  We placed a referral to ophthalmology at his last visit and advised he call us in 1-2 weeks if not set up there for evaluation of intermittent visual disturbance for several years. Reports saw optho here and in [place] and was told everything was good. No further issues since. Denies CP, SOB, DOE, treatment intolerance or new symptoms.  Refuses labs today as reports did them all in [place] recently and reports they were good. He does not have insurance so prefers to skip. Did not take his medication today, but takes daily usually. Reports eating healthy and getting regular exercise.  HTN: -meds: olmesartan-hctz   HLD: -mild, lifestyle recs advised -swimming, diet improved per his report  ROS: See pertinent positives and negatives per HPI.  Past Medical History:  Diagnosis Date  . Ear pain    sees ENT  . Hypertension     History reviewed. No pertinent surgical history.  Family History  Problem Relation Age of Onset  . Heart disease Father 71       MI  . Heart disease Brother        vulvular disease  . Hypertension Brother     SOCIAL HX: see hpi   Current Outpatient Medications:  .  olmesartan-hydrochlorothiazide (BENICAR HCT) 40-25 MG tablet, Take 1 tablet by mouth daily., Disp: 90 tablet, Rfl: 3  EXAM:  Vitals:   10/15/18 0759  BP: 108/80  Pulse: 86  Temp: 98 F (36.7 C)    Body mass index is 28.54 kg/m.  GENERAL: vitals reviewed and listed above, alert, oriented, appears well hydrated and in no acute distress  HEENT: atraumatic, conjunttiva clear, no obvious abnormalities on inspection of external nose and ears  NECK: no obvious  masses on inspection  LUNGS: clear to auscultation bilaterally, no wheezes, rales or rhonchi, good air movement  CV: HRRR, no peripheral edema  MS: moves all extremities without noticeable abnormality  PSYCH: pleasant and cooperative, no obvious depression or anxiety  ASSESSMENT AND PLAN:  Discussed the following assessment and plan:  Essential hypertension  Hyperlipidemia, unspecified hyperlipidemia type  -declined labs -lifetsyle recs, continue meds -labs and CPE next visit and asked that he take meds prior to visit -requested labs/reports - he agrees to bring -Patient advised to return or notify a doctor immediately if symptoms worsen or persist or new concerns arise.  Patient Instructions  BEFORE YOU LEAVE: -follow up: CPE with fasting labs in 3-4 months (please take medications prior to visit with water.) -bring copy eye reports and labs from here and [place]  We recommend the following healthy lifestyle for LIFE: 1) Small portions. But, make sure to get regular (at least 3 per day), healthy meals and small healthy snacks if needed.  2) Eat a healthy clean diet.   TRY TO EAT: -at least 5-7 servings of low sugar, colorful, and nutrient rich vegetables per day (not corn, potatoes or bananas.) -berries are the best choice if you wish to eat fruit (only eat small amounts if trying to reduce weight)  -lean meets (fish, white meat of chicken or [place]) -vegan proteins for some meals - beans or tofu, whole grains, nuts and seeds -Replace bad fats with good fats -  good fats include: fish, nuts and seeds, canola oil, olive oil -small amounts of low fat or non fat dairy -small amounts of100 % whole grains - check the lables -drink plenty of water  AVOID: -SUGAR, sweets, anything with added sugar, corn syrup or sweeteners - must read labels as even foods advertised as "healthy" often are loaded with sugar -if you must have a sweetener, small amounts of stevia may be  best -sweetened beverages and artificially sweetened beverages -simple starches (rice, bread, potatoes, pasta, chips, etc - small amounts of 100% whole grains are ok) -red meat, pork, butter -fried foods, fast food, processed food, excessive dairy, eggs and coconut.  3)Get at least 150 minutes of sweaty aerobic exercise per week.  4)Reduce stress - consider counseling, meditation and relaxation to balance other aspects of your life.     Terressa KoyanagiHannah R Kim, DO

## 2018-10-15 NOTE — Patient Instructions (Addendum)
BEFORE YOU LEAVE: -follow up: CPE with fasting labs in 3-4 months (please take medications prior to visit with water.) -bring copy eye reports and labs from here and [place]  We recommend the following healthy lifestyle for LIFE: 1) Small portions. But, make sure to get regular (at least 3 per day), healthy meals and small healthy snacks if needed.  2) Eat a healthy clean diet.   TRY TO EAT: -at least 5-7 servings of low sugar, colorful, and nutrient rich vegetables per day (not corn, potatoes or bananas.) -berries are the best choice if you wish to eat fruit (only eat small amounts if trying to reduce weight)  -lean meets (fish, white meat of chicken or [place]) -vegan proteins for some meals - beans or tofu, whole grains, nuts and seeds -Replace bad fats with good fats - good fats include: fish, nuts and seeds, canola oil, olive oil -small amounts of low fat or non fat dairy -small amounts of100 % whole grains - check the lables -drink plenty of water  AVOID: -SUGAR, sweets, anything with added sugar, corn syrup or sweeteners - must read labels as even foods advertised as "healthy" often are loaded with sugar -if you must have a sweetener, small amounts of stevia may be best -sweetened beverages and artificially sweetened beverages -simple starches (rice, bread, potatoes, pasta, chips, etc - small amounts of 100% whole grains are ok) -red meat, pork, butter -fried foods, fast food, processed food, excessive dairy, eggs and coconut.  3)Get at least 150 minutes of sweaty aerobic exercise per week.  4)Reduce stress - consider counseling, meditation and relaxation to balance other aspects of your life.

## 2019-02-19 ENCOUNTER — Ambulatory Visit: Payer: Self-pay | Admitting: Family Medicine

## 2019-04-24 ENCOUNTER — Ambulatory Visit: Payer: Self-pay | Admitting: Family Medicine

## 2019-06-24 ENCOUNTER — Other Ambulatory Visit: Payer: Self-pay

## 2019-06-24 ENCOUNTER — Encounter: Payer: Self-pay | Admitting: Family Medicine

## 2019-06-24 ENCOUNTER — Ambulatory Visit (INDEPENDENT_AMBULATORY_CARE_PROVIDER_SITE_OTHER): Payer: Self-pay | Admitting: Family Medicine

## 2019-06-24 VITALS — BP 128/90 | HR 70 | Temp 98.5°F | Ht 65.25 in | Wt 168.5 lb

## 2019-06-24 DIAGNOSIS — E785 Hyperlipidemia, unspecified: Secondary | ICD-10-CM

## 2019-06-24 DIAGNOSIS — I10 Essential (primary) hypertension: Secondary | ICD-10-CM

## 2019-06-24 MED ORDER — OLMESARTAN MEDOXOMIL-HCTZ 40-25 MG PO TABS: 1.0000 | ORAL_TABLET | Freq: Every day | ORAL | 3 refills | Status: DC

## 2019-06-24 MED ORDER — OMEPRAZOLE 20 MG PO CPDR: 20.0000 mg | DELAYED_RELEASE_CAPSULE | Freq: Every day | ORAL | 1 refills | Status: AC | PRN

## 2019-06-24 NOTE — Progress Notes (Addendum)
Joseph Wong DOB: [birth year] Encounter date: 06/24/2019  This is a 63 y.o. male who presents to establish care. Chief Complaint  Patient presents with  . Establish Care    History of present illness:   HTN: on olmesartan-hctz 40-25 daily. No bloodwork since 05/2017. Highest he has seen was 135/95, but most around 120's/mid 80's.   OY:DXAJ controlled.  GERD: has taken prilosec otc for heartburn symtpoms for a couple of weeks. Would like to have the pirlosec just in case needed.   Had colonoscopy in [place] 01/30/2018 that was normal except for hemorrhoid. Also had endoscopy that was normal at this time. In [place] they tend to screen more often like q 3 years.   Had CXR at that time (01/2018); also normal.   Bone density normal from 01/2018.  Had flu shot completed already 3 weeks ago.    Past Medical History:  Diagnosis Date  . Ear pain    sees ENT  . Hypertension    Past Surgical History:  Procedure Laterality Date  . NO PAST SURGERIES     No Known Allergies Current Meds  Medication Sig  . olmesartan-hydrochlorothiazide (BENICAR HCT) 40-25 MG tablet Take 1 tablet by mouth daily.  . [DISCONTINUED] olmesartan-hydrochlorothiazide (BENICAR HCT) 40-25 MG tablet Take 1 tablet by mouth daily.   Social History   Tobacco Use  . Smoking status: Former Games developer  . Smokeless tobacco: Never Used  . Tobacco comment: [age] yo, 1ppd  Substance Use Topics  . Alcohol use: Yes    Alcohol/week: 0.0 standard drinks    Comment: 3-4 x/week   Family History  Problem Relation Age of Onset  . Heart disease Father 4       MI  . Heart disease Brother        vulvular disease  . Hypertension Brother   . Other Mother        tetanus  . Cancer Neg Hx   . Diabetes Neg Hx       Review of Systems  Constitutional: Negative for chills, fatigue and fever.  Respiratory: Negative for cough, chest tightness, shortness of breath and wheezing.   Cardiovascular: Negative for chest pain,  palpitations and leg swelling.    Objective:  BP 128/90 (BP Location: Left Arm, Patient Position: Sitting, Cuff Size: Normal)   Pulse 70   Temp 98.5 F (36.9 C) (Temporal)   Ht 5' 5.25" (1.657 m)   Wt 168 lb 8 oz (76.4 kg)   SpO2 97%   BMI 27.83 kg/m   Weight: 168 lb 8 oz (76.4 kg)   Recheck bp was 138/90  BP Readings from Last 3 Encounters:  06/24/19 128/90  10/15/18 108/80  06/05/17 98/78   Wt Readings from Last 3 Encounters:  06/24/19 168 lb 8 oz (76.4 kg)  10/15/18 172 lb 12.8 oz (78.4 kg)  06/05/17 172 lb 14.4 oz (78.4 kg)    Physical Exam Constitutional:      General: He is not in acute distress.    Appearance: He is well-developed.  Cardiovascular:     Rate and Rhythm: Normal rate and regular rhythm.     Heart sounds: Normal heart sounds. No murmur. No friction rub.  Pulmonary:     Effort: Pulmonary effort is normal. No respiratory distress.     Breath sounds: Normal breath sounds. No wheezing or rales.  Musculoskeletal:     Right lower leg: No edema.     Left lower leg: No edema.  Neurological:  Mental Status: He is alert and oriented to person, place, and time.  Psychiatric:        Behavior: Behavior normal.     Assessment/Plan:  1. Essential hypertension BP higher than desired in office. Numbers somewhat better at home (and better previously in our office). He will keep checking at home and I will check back in with him in a couple of weeks to see how numbers are looking. **note addended because patient was actually only taking 1/2 of his blood pressure tablet daily. Advised increase to whole tab, keep monitoring. We will check back in. Refused labs here; will get these back in [place] (going soon). Advised we need results to fill med. Discussed risk of strain on kidneys/electrolytes with hctz component.  - CBC with Differentia el/Platelet; Future - Comprehensive metabolic panel; Future - olmesartan-hydrochlorothiazide (BENICAR HCT) 40-25 MG tablet;  Take 1 tablet by mouth daily.  Dispense: 90 tablet; Refill: 3  2. Hyperlipidemia, unspecified hyperlipidemia type - Lipid panel; Future  Return in about 6 months (around 12/23/2019) for physical exam.   All of his paperwork from [place] was copied and scanned to chart.   Micheline Rough, MD

## 2019-07-22 ENCOUNTER — Telehealth: Payer: Self-pay | Admitting: *Deleted

## 2019-07-22 NOTE — Telephone Encounter (Signed)
-----   Message from Caren Macadam, MD sent at 07/21/2019  1:32 PM EST ----- Please check in with patient and see how blood pressures are doing with increase in medication.

## 2019-07-22 NOTE — Telephone Encounter (Signed)
I called the pt and informed him of the message below.   Patient agreed to call back with BP numbers in 2 weeks and was advised to call sooner if needed.

## 2019-07-22 NOTE — Telephone Encounter (Signed)
Ok; well; he can return to the 1/2 tablet and then keep monitoring. It can take a couple of weeks for body to normalize with changes, so keep tracking and let me know how numbers are looking after that change!

## 2019-07-22 NOTE — Telephone Encounter (Signed)
I called the pt and informed him of the message below.  States BP readings have been 117/74 in the mornings, states readings in the afternoons have been 110-114/65-71s or lower, even in the evenings 49-67 diastolic. States he took 1/2 tablet yesterday as he felt it was too low and the whole tablet is too strong.  Today's reading was 120/80.  Message sent to Dr Ethlyn Gallery.

## 2019-07-23 ENCOUNTER — Encounter: Payer: Self-pay | Admitting: Family Medicine

## 2019-08-01 ENCOUNTER — Telehealth: Payer: Self-pay | Admitting: Family Medicine

## 2019-08-01 ENCOUNTER — Ambulatory Visit: Payer: Self-pay | Admitting: *Deleted

## 2019-08-01 NOTE — Telephone Encounter (Signed)
  Pt called in using [place] interpreter.   He is c/o his BP being low and feeling very tired in the afternoons around 3:00.  I warm transferred his call into Dr. Berenice Bouton office to [facility] to be scheduled.  Protocol was to call PCP however after talking with him I felt an appt was more in order.  Pt was agreeable to this.  I sent my triage notes to the office.  Reason for Disposition . [1] Caller has URGENT medication question about med that PCP or specialist prescribed AND [2] triager unable to answer question  Answer Assessment - Initial Assessment Questions 1.   NAME of MEDICATION: "What medicine are you calling about?"   Pt called in using interpreter for [place].  Benicar.    I've been on it for 13 years.   I've been taking 1 pill.   A couple of years ago I asked if I could take 1/2 pill instead of 1 pill.   Dr. Michela Pitcher yes.   I've been taking 1/2 a pill of the Benicar.    2 months ago the dr asked me why I was taking 1/2 pill so it was increased back to 1 pill.   Now my BP is low and I'm tired.    I called in about Nov. 1st, 2020. 2.   QUESTION: "What is your question?"     My BP is going low in the evenings. 3.   PRESCRIBING HCP: "Who prescribed it?" Reason: if prescribed by specialist, call should be referred to that group.     Dr. Ethlyn Gallery I think 4. SYMPTOMS: "Do you have any symptoms?"     When I wake up in morning it's fine.   BP is fine.   About 3:00 PM my BP declines  And keeps going down   85/58.   It's so low I feel tired and want to go to sleep early.   I've been going to bed early.   I'm having low BP and being tired on 1/2 a pill now.    I feel ok in the mornings but it's after 3:00 I see the BP getting lower and feeling tired.     5. SEVERITY: If symptoms are present, ask "Are they mild, moderate or severe?"     Severe   Low BP and very tired 6.  PREGNANCY:  "Is there any chance that you are pregnant?" "When was your last menstrual period?"     N/A  Protocols used:  MEDICATION QUESTION CALL-A-AH

## 2019-08-01 NOTE — Telephone Encounter (Signed)
I called the pt and informed him he should be evaluated sooner than 11/16.  Appt scheduled for a virtual visit on 11/13 at 11:30am.

## 2019-08-01 NOTE — Telephone Encounter (Signed)
error 

## 2019-08-02 ENCOUNTER — Telehealth (INDEPENDENT_AMBULATORY_CARE_PROVIDER_SITE_OTHER): Payer: Self-pay | Admitting: Family Medicine

## 2019-08-02 ENCOUNTER — Other Ambulatory Visit: Payer: Self-pay

## 2019-08-02 ENCOUNTER — Encounter: Payer: Self-pay | Admitting: Family Medicine

## 2019-08-02 VITALS — BP 110/75 | Temp 98.5°F

## 2019-08-02 DIAGNOSIS — I10 Essential (primary) hypertension: Secondary | ICD-10-CM

## 2019-08-02 NOTE — Progress Notes (Signed)
Virtual Visit via Telephone Note  I connected with Joseph Wong on 08/02/19 at 11:30 AM EST by telephone and verified that I am speaking with the correct person using two identifiers.   I discussed the limitations, risks, security and privacy concerns of performing an evaluation and management service by telephone and the availability of in person appointments. I also discussed with the patient that there may be a patient responsible charge related to this service. The patient expressed understanding and agreed to proceed.  Location patient: home Location provider: work office Participants present for the call: patient, provider Patient did not have a visit in the prior 7 days to address this/these issue(s).   History of Present Illness: Visit done using interpreter  At last visit advised to take whole bp tablet (was only doing half) since bp was elevated in office.   He is taking his medication after breakfast around 8:30 in the morning. Was taking whole tablet for a month, but just a half since November 1st. He is still having issue with low blood pressures. Morning pressures are normal in low 100's but in afternoon dropping to 90/60's, but even down to 80's/50's in evening. Has been eating softer foods - lost about 4lbs; due to stomach upset. Not sure if this is related.   November 4th noted that bp was dropping; then started with chest discomfort. Would rest, and recover. Just happened one time.   In last few days, with being careful with what he is eating abd feels better. No chest discomfort, pressure, pain now. No difficulty with breathing now. Still running business, being cautious with mask wearing - breaks only when not around other people.   Not exercising now due to pandemic and [employer] being closed.   If he takes whole pill, then felt dizzy and tired in evening. Eyelids feel heavy and would go to bed early. Since yesterday feeling less tired than before.     Observations/Objective: Patient sounds cheerful and well on the phone. I do not appreciate any SOB. Speech and thought processing are grossly intact. Patient reported vitals: bp 110/75  Assessment and Plan: 1. Essential hypertension After discussion, he would like to monitor pressures this week and see how he does and feels before making further adjustments since he is starting to feel better with higher (normal) pressures in last day. I will check in with him next week to get status report. If we need to make adjustment I would prescribe 20mg  benicar to break in half and drop the hctz component.   Follow Up Instructions:  Pending phone update next week.    [id number] [id number] [id number] 11-20 [id number] 21-30 I did not refer this patient for an OV in the next 24 hours for this/these issue(s).  I discussed the assessment and treatment plan with the patient. The patient was provided an opportunity to ask questions and all were answered. The patient agreed with the plan and demonstrated an understanding of the instructions.   The patient was advised to call back or seek an in-person evaluation if the symptoms worsen or if the condition fails to improve as anticipated.  I provided 23 minutes of non-face-to-face time during this encounter.   Micheline Rough, MD

## 2019-08-05 ENCOUNTER — Ambulatory Visit: Payer: Self-pay | Admitting: Family Medicine

## 2019-08-09 ENCOUNTER — Telehealth: Payer: Self-pay | Admitting: *Deleted

## 2019-08-09 MED ORDER — OLMESARTAN MEDOXOMIL 20 MG PO TABS: 20.0000 mg | ORAL_TABLET | Freq: Every day | ORAL | 0 refills | Status: DC

## 2019-08-09 NOTE — Telephone Encounter (Signed)
Those are pretty low pressures. If he wants to continue to monitor, he can, but other option would be to switch to:  benicar 20mg  daily. This will drop the hctz component and should help raise bp just slightly. Then if he is still low he can break this in half.   Either way have him update [place] in about 2 weeks time (might be easier if we call him). Ok to send in new rx for 20mg  benicar 90 tablets if desired. Let me know if any questions/concerns.

## 2019-08-09 NOTE — Telephone Encounter (Signed)
-----   Message from Caren Macadam, MD sent at 08/02/2019  1:03 PM EST ----- Please call patient in 1 weeks time to check and see how he is feeling and how blood pressures are running (11/20) thanks!

## 2019-08-09 NOTE — Telephone Encounter (Signed)
I called the pt and he stated he has noticed some dizziness but is feeling OK.  BP readings are as below: 11/17--8:15am-115/69--4pm-111/71--10pm-99/62 11/18--114/75--4pm-106/63--10pm-102/62 11/19--111/67--4pm-129/75--4:30--113/58--10pm-96/61 11/20--108/70  Message sent to Dr Ethlyn Gallery.

## 2019-08-09 NOTE — Telephone Encounter (Signed)
Spoke with the pt and informed him of the message below.  Patient stated he would prefer to try a different medication and is aware the Rx for Benicar 20mg  was sent to the pharmacy.  Patient agreed to call back in 2 weeks with blood pressure readings or sooner if needed.

## 2019-08-16 ENCOUNTER — Ambulatory Visit: Payer: Self-pay | Admitting: *Deleted

## 2019-08-16 NOTE — Telephone Encounter (Signed)
Pt said his bp went up from this morning when it was 128/85 and it has been going up since 156/101   ( 11/20-Patient had changes in BP due to low readings: Olmesartan 20 mg- HCTZ component removed)  Call to patient- patient reports he has had great BP readings since the medication change. Patient states he had high reading today- the other readings have been better and currently BP 141/84. Advised patient of perimeters for care and he understands. Patient advised the office will reach out on Monday to schedule appointment. Patient reports he did drink wine yesterday and wants to know if that could have made the BP go up. Stated it is possible- he will continue to monitor BP- call back if gets higher and go to UC/ED for elevations with/without symptoms. Reason for Disposition . [5] Systolic BP  >= 009 OR Diastolic >= 80 AND [3] taking BP medications  Answer Assessment - Initial Assessment Questions 1. BLOOD PRESSURE: "What is the blood pressure?" "Did you take at least two measurements 5 minutes apart?"     11:50 156/101, 12:00 131/87, 2:00 131/75 2. ONSET: "When did you take your blood pressure?"     today 3. HOW: "How did you obtain the blood pressure?" (e.g., visiting nurse, automatic home BP monitor)     Auto cuff on arm 4. HISTORY: "Do you have a history of high blood pressure?"     yes 5. MEDICATIONS: "Are you taking any medications for blood pressure?" "Have you missed any doses recently?"     Yes- recent change, 11/21- patient had good BP- perfect 6. OTHER SYMPTOMS: "Do you have any symptoms?" (e.g., headache, chest pain, blurred vision, difficulty breathing, weakness)     No- slight headache when BP high this morning, face warm 7. PREGNANCY: "Is there any chance you are pregnant?" "When was your last menstrual period?"     n/a  Protocols used: HIGH BLOOD PRESSURE-A-AH

## 2019-08-19 NOTE — Telephone Encounter (Signed)
Can you please call him back, see how pressures are doing. Confirm current blood pressure medications.

## 2019-08-21 ENCOUNTER — Other Ambulatory Visit: Payer: Self-pay

## 2019-08-21 ENCOUNTER — Telehealth (INDEPENDENT_AMBULATORY_CARE_PROVIDER_SITE_OTHER): Payer: Self-pay | Admitting: Family Medicine

## 2019-08-21 DIAGNOSIS — I10 Essential (primary) hypertension: Secondary | ICD-10-CM

## 2019-08-21 NOTE — Patient Instructions (Addendum)
I would like for you to keep checking your blood pressures twice daily.   It is best to check a couple of hours after you take your medication, but I think in the beginning of the medication switch to 1/2 tablet twice daily, it will be helpful for you to check your pressures prior to taking the medication as well.   If your pressure is below 115 (top number) or 70 (bottom number), then please do not take the medication.   If you have concerns with pressures that are either too low (100 top number or lower) regularly or too high (over 145 top number regularly), please let me know.   I will be in touch with you once I see your bloodwork results.

## 2019-08-21 NOTE — Progress Notes (Signed)
Virtual Visit via Video Note  I connected with Joseph Wong  on 08/21/19 at  2:30 PM EST by a video enabled telemedicine application and verified that I am speaking with the correct person using two identifiers.  Location patient: home Location provider:work or home office Persons participating in the virtual visit: patient, provider  I discussed the limitations of evaluation and management by telemedicine and the availability of in person appointments. The patient expressed understanding and agreed to proceed.   History of Present Illness: Interpreter was used through speaker phone during entire visit.   We have been adjusting bp medication. At visit 10/5 he was taking 1/2 tab of olmesartan-hctz 40-25 and pressures were in the mid 120's/80's with some as high as 135/95. Pressure in office that day was 128/90 with higher recheck. Patient advised to increase to whole tab of medication. Had lower pressures with this so decreased to 1/2 tab again. Pressures remained low but seemed to be stablizine at visit 11/13 so he monitored longer and reported back to me on 11/20. We further decreased medication to benicar 20mg  daily. He then called back with higher pressures and was asked to schedule follow up visit.   From November 21- a week ago bp were ok. But since 11/27 bp has been running higher - 156/96. Since the 27th in the morning up to 10am is increased over 150 and then comes down. Takes bp medication in the morning. Usually takes his blood pressure medication at 8am. By 9pm then pressure is normal. 139/84 average. Lowest pressure he has had 10pm: 111/69, 93/64.   One time he brought monitor to office it was ok, but seems to change more on him at home.   Review of Systems  Constitutional: Negative for chills and fever.  Respiratory: Negative for cough and shortness of breath.   Cardiovascular: Negative for chest pain and palpitations.       Feels a little flushed when bp is elevated   Neurological: Negative for dizziness and headaches.      Observations/Objective: EXAM:  GENERAL: alert, oriented, appears well and in no acute distress  HEENT: atraumatic, conjunctiva clear, no obvious abnormalities on inspection of external nose and ears  NECK: normal movements of the head and neck  LUNGS: on inspection no signs of respiratory distress, breathing rate appears normal, no obvious gross SOB, gasping or wheezing  CV: no obvious cyanosis  MS: moves all visible extremities without noticeable abnormality  PSYCH/NEURO: pleasant and cooperative, no obvious depression or anxiety, speech and thought processing grossly intact   Assessment and Plan: 1. Essential hypertension Blood pressures have been quite variable for him.  Although morning pressures are high, even after taking medication, he is getting low pressures in the evening.  We discussed different reasons for this, and I feel that it might be easiest for 28 to split his current blood pressure medication dose with the thought that he may be more quickly metabolizing this and medication is coming out of his system by those morning blood pressure readings. New dose will be benicar 20mg  1/2 tab BID.  It has also been a year since he last had blood work.  He was planning to go to [place] to get this repeated, but trip has been delayed secondary to Covid.  He is willing to go get blood work now which we have set up for him today.   Follow Up Instructions:  Follow up for bloodwork; then we will plan follow up visit based  on how blood pressures are looking   Micheline Rough, MD  I discussed the assessment and treatment plan with the patient. The patient was provided an opportunity to ask questions and all were answered. The patient agreed with the plan and demonstrated an understanding of the instructions.   The patient was advised to call back or seek an in-person evaluation if the symptoms worsen or if the condition  fails to improve as anticipated.  I provided 35 minutes of non-face-to-face time during this encounter.

## 2019-08-22 MED ORDER — OLMESARTAN MEDOXOMIL 20 MG PO TABS: 10.0000 mg | ORAL_TABLET | Freq: Two times a day (BID) | ORAL | 0 refills | Status: AC

## 2019-09-02 ENCOUNTER — Other Ambulatory Visit: Payer: Self-pay

## 2019-09-02 ENCOUNTER — Other Ambulatory Visit (INDEPENDENT_AMBULATORY_CARE_PROVIDER_SITE_OTHER): Payer: Self-pay

## 2019-09-02 DIAGNOSIS — I10 Essential (primary) hypertension: Secondary | ICD-10-CM

## 2019-09-02 DIAGNOSIS — E785 Hyperlipidemia, unspecified: Secondary | ICD-10-CM

## 2019-09-02 LAB — CBC WITH DIFFERENTIAL/PLATELET
Basophils Absolute: 0 10*3/uL (ref 0.0–0.1)
Basophils Relative: 0.4 % (ref 0.0–3.0)
Eosinophils Absolute: 0.1 10*3/uL (ref 0.0–0.7)
Eosinophils Relative: 2.2 % (ref 0.0–5.0)
HCT: 44.8 % (ref 39.0–52.0)
Hemoglobin: 15.1 g/dL (ref 13.0–17.0)
Lymphocytes Relative: 40 % (ref 12.0–46.0)
Lymphs Abs: 2.1 10*3/uL (ref 0.7–4.0)
MCHC: 33.7 g/dL (ref 30.0–36.0)
MCV: 89.9 fl (ref 78.0–100.0)
Monocytes Absolute: 0.4 10*3/uL (ref 0.1–1.0)
Monocytes Relative: 7.7 % (ref 3.0–12.0)
Neutro Abs: 2.6 10*3/uL (ref 1.4–7.7)
Neutrophils Relative %: 49.7 % (ref 43.0–77.0)
Platelets: 249 10*3/uL (ref 150.0–400.0)
RBC: 4.99 Mil/uL (ref 4.22–5.81)
RDW: 13.4 % (ref 11.5–15.5)
WBC: 5.2 10*3/uL (ref 4.0–10.5)

## 2019-09-02 LAB — LIPID PANEL
Cholesterol: 161 mg/dL (ref 0–200)
HDL: 41.5 mg/dL (ref >39.00)
LDL Cholesterol: 105 mg/dL — ABNORMAL HIGH (ref 0–99)
NonHDL: 119.43
Total CHOL/HDL Ratio: 4
Triglycerides: 74 mg/dL (ref 0.0–149.0)
VLDL: 14.8 mg/dL (ref 0.0–40.0)

## 2019-09-02 LAB — COMPREHENSIVE METABOLIC PANEL
ALT: 15 U/L (ref 0–53)
AST: 16 U/L (ref 0–37)
Albumin: 4.1 g/dL (ref 3.5–5.2)
Alkaline Phosphatase: 93 U/L (ref 39–117)
BUN: 22 mg/dL (ref 6–23)
CO2: 26 mEq/L (ref 19–32)
Calcium: 9 mg/dL (ref 8.4–10.5)
Chloride: 106 mEq/L (ref 96–112)
Creatinine, Ser: 0.85 mg/dL (ref 0.40–1.50)
GFR: 91.21 mL/min (ref >60.00)
Glucose, Bld: 105 mg/dL — ABNORMAL HIGH (ref 70–99)
Potassium: 3.7 mEq/L (ref 3.5–5.1)
Sodium: 138 mEq/L (ref 135–145)
Total Bilirubin: 0.6 mg/dL (ref 0.2–1.2)
Total Protein: 6.9 g/dL (ref 6.0–8.3)

## 2019-12-23 ENCOUNTER — Encounter: Payer: Self-pay | Admitting: Family Medicine

## 2020-05-31 ENCOUNTER — Encounter: Payer: Self-pay | Admitting: *Deleted

## 2021-02-06 ENCOUNTER — Encounter: Payer: Self-pay | Admitting: *Deleted

## 2021-02-06 NOTE — Congregational Nurse Program (Signed)
  Dept: [phone]   Congregational Nurse Program Note  Date of Encounter: 02/06/2021  Past Medical History: Past Medical History:  Diagnosis Date  . Ear pain    sees ENT  . Hypertension     Encounter Details:  CNP Questionnaire - 02/06/21 1820      Questionnaire   Do you give verbal consent to treat you today? Yes    Visit Setting Home    Location Patient Served At Not Applicable   home visit   Patient Status Immigrant    Medical Provider Yes    Insurance Private Insurance    Intervention Assess (including screenings);Support;Spiritual Care;Educate    Housing/Utilities --   NA   Transportation --   NA   Interpersonal Safety --   NA   Food --   NA   Medication --   NA   Referrals Emergency Department    Screening Referrals --   head CT   ED Visit Averted --   pt is waiting for now, if worse sx,will visit ER   Life-Saving Intervention Made --   NA         pt c/o dizziness and HA  Denies spinning head, no different to change position, checked Stroke exam, negative. Wife will be back tomorrow am from visiting son's house, friend at bed side. Pt called friend for HA and Dizziness around 9 pm yesterday, took some tylenol 650mg  po then fell in sleep, started Ha around 2 am. And nausea not vomiting. Friend called me and visited home, BP was 120~130/70~80 no fever, during the night.referred to go to ER to check brain if there's any wrong , pt will wait til am. Tylenol 650~1g po Q6 hrs and advil up to 800 mg Q8 hrs with food if HA is severe, go to ER. Will check up later.

## 2021-02-06 NOTE — Congregational Nurse Program (Unsigned)
covid test negative  With home kit.

## 2021-02-07 ENCOUNTER — Emergency Department (HOSPITAL_BASED_OUTPATIENT_CLINIC_OR_DEPARTMENT_OTHER): Payer: 59

## 2021-02-07 ENCOUNTER — Emergency Department (HOSPITAL_BASED_OUTPATIENT_CLINIC_OR_DEPARTMENT_OTHER)
Admission: EM | Admit: 2021-02-07 | Discharge: 2021-02-07 | Disposition: A | Discharge status: Home / Self Care | Payer: 59 | Attending: Emergency Medicine | Admitting: Emergency Medicine

## 2021-02-07 ENCOUNTER — Encounter (HOSPITAL_BASED_OUTPATIENT_CLINIC_OR_DEPARTMENT_OTHER): Payer: Self-pay

## 2021-02-07 ENCOUNTER — Emergency Department (HOSPITAL_BASED_OUTPATIENT_CLINIC_OR_DEPARTMENT_OTHER): Payer: 59 | Admitting: Radiology

## 2021-02-07 ENCOUNTER — Other Ambulatory Visit: Payer: Self-pay

## 2021-02-07 DIAGNOSIS — I10 Essential (primary) hypertension: Secondary | ICD-10-CM | POA: Diagnosis not present

## 2021-02-07 DIAGNOSIS — K2900 Acute gastritis without bleeding: Secondary | ICD-10-CM | POA: Insufficient documentation

## 2021-02-07 DIAGNOSIS — K219 Gastro-esophageal reflux disease without esophagitis: Secondary | ICD-10-CM | POA: Insufficient documentation

## 2021-02-07 DIAGNOSIS — R1013 Epigastric pain: Secondary | ICD-10-CM | POA: Diagnosis present

## 2021-02-07 DIAGNOSIS — Z79899 Other long term (current) drug therapy: Secondary | ICD-10-CM | POA: Diagnosis not present

## 2021-02-07 DIAGNOSIS — R519 Headache, unspecified: Secondary | ICD-10-CM | POA: Insufficient documentation

## 2021-02-07 DIAGNOSIS — Z20822 Contact with and (suspected) exposure to covid-19: Secondary | ICD-10-CM | POA: Insufficient documentation

## 2021-02-07 DIAGNOSIS — R079 Chest pain, unspecified: Secondary | ICD-10-CM

## 2021-02-07 DIAGNOSIS — Z87891 Personal history of nicotine dependence: Secondary | ICD-10-CM | POA: Diagnosis not present

## 2021-02-07 DIAGNOSIS — R42 Dizziness and giddiness: Secondary | ICD-10-CM | POA: Insufficient documentation

## 2021-02-07 LAB — CBC WITH DIFFERENTIAL/PLATELET
Abs Immature Granulocytes: 0.02 10*3/uL (ref 0.00–0.07)
Basophils Absolute: 0 10*3/uL (ref 0.0–0.1)
Basophils Relative: 1 %
Eosinophils Absolute: 0.1 10*3/uL (ref 0.0–0.5)
Eosinophils Relative: 1 %
HCT: 46.5 % (ref 39.0–52.0)
Hemoglobin: 15.9 g/dL (ref 13.0–17.0)
Immature Granulocytes: 0 %
Lymphocytes Relative: 17 %
Lymphs Abs: 1.5 10*3/uL (ref 0.7–4.0)
MCH: 29.9 pg (ref 26.0–34.0)
MCHC: 34.2 g/dL (ref 30.0–36.0)
MCV: 87.4 fL (ref 80.0–100.0)
Monocytes Absolute: 0.5 10*3/uL (ref 0.1–1.0)
Monocytes Relative: 6 %
Neutro Abs: 6.5 10*3/uL (ref 1.7–7.7)
Neutrophils Relative %: 75 %
Platelets: 253 10*3/uL (ref 150–400)
RBC: 5.32 MIL/uL (ref 4.22–5.81)
RDW: 12.4 % (ref 11.5–15.5)
WBC: 8.6 10*3/uL (ref 4.0–10.5)
nRBC: 0 % (ref 0.0–0.2)

## 2021-02-07 LAB — COMPREHENSIVE METABOLIC PANEL
ALT: 24 U/L (ref 0–44)
AST: 23 U/L (ref 15–41)
Albumin: 4 g/dL (ref 3.5–5.0)
Alkaline Phosphatase: 55 U/L (ref 38–126)
Anion gap: 9 (ref 5–15)
BUN: 12 mg/dL (ref 8–23)
CO2: 25 mmol/L (ref 22–32)
Calcium: 9.1 mg/dL (ref 8.9–10.3)
Chloride: 101 mmol/L (ref 98–111)
Creatinine, Ser: 0.77 mg/dL (ref 0.61–1.24)
GFR, Estimated: >60 mL/min (ref >60)
Glucose, Bld: 87 mg/dL (ref 70–99)
Potassium: 3.6 mmol/L (ref 3.5–5.1)
Sodium: 135 mmol/L (ref 135–145)
Total Bilirubin: 0.9 mg/dL (ref 0.3–1.2)
Total Protein: 7.4 g/dL (ref 6.5–8.1)

## 2021-02-07 LAB — URINALYSIS, ROUTINE W REFLEX MICROSCOPIC
Bilirubin Urine: NEGATIVE
Glucose, UA: NEGATIVE mg/dL
Hgb urine dipstick: NEGATIVE
Ketones, ur: 15 mg/dL — AB
Leukocytes,Ua: NEGATIVE
Nitrite: NEGATIVE
Protein, ur: NEGATIVE mg/dL
Specific Gravity, Urine: 1.013 (ref 1.005–1.030)
pH: 6 (ref 5.0–8.0)

## 2021-02-07 LAB — RESP PANEL BY RT-PCR (FLU A&B, COVID) ARPGX2
Influenza A by PCR: NEGATIVE
Influenza B by PCR: NEGATIVE
SARS Coronavirus 2 by RT PCR: NEGATIVE

## 2021-02-07 LAB — LIPASE, BLOOD: Lipase: 26 U/L (ref 11–51)

## 2021-02-07 LAB — TROPONIN I (HIGH SENSITIVITY): Troponin I (High Sensitivity): 3 ng/L (ref <18)

## 2021-02-07 MED ORDER — ONDANSETRON HCL 4 MG PO TABS: 4.0000 mg | ORAL_TABLET | Freq: Four times a day (QID) | ORAL | 0 refills | Status: AC

## 2021-02-07 MED ORDER — PANTOPRAZOLE SODIUM 20 MG PO TBEC: 40.0000 mg | DELAYED_RELEASE_TABLET | Freq: Every day | ORAL | 0 refills | Status: DC

## 2021-02-07 MED ORDER — PANTOPRAZOLE SODIUM 20 MG PO TBEC: 40.0000 mg | DELAYED_RELEASE_TABLET | Freq: Every day | ORAL | 0 refills | Status: AC

## 2021-02-07 MED ORDER — SODIUM CHLORIDE 0.9 % IV BOLUS
1000.0000 mL | Freq: Once | INTRAVENOUS | Status: AC
  Administered 2021-02-07: 1000 mL via INTRAVENOUS

## 2021-02-07 MED ORDER — ONDANSETRON HCL 4 MG PO TABS: 4.0000 mg | ORAL_TABLET | Freq: Four times a day (QID) | ORAL | 0 refills | Status: DC

## 2021-02-07 NOTE — ED Triage Notes (Signed)
Pt with HA, fatigue, chills and sweats. Symptoms started 5/20. Pt has been vaccinated for flu and COVID. Denies sick contacts.

## 2021-02-07 NOTE — ED Notes (Signed)
Pt verbalizes understanding of discharge instructions. Opportunity for questioning and answers were provided. Armand removed by staff, pt discharged from ED to home. Educated to pick up Rx.  

## 2021-02-07 NOTE — ED Provider Notes (Signed)
[facility] EMERGENCY DEPT Provider Note   CSN: [id number] Arrival date & time: 02/07/21  1512     History Chief Complaint  Patient presents with  . Headache    Pan Joseph Wong is a 65 y.o. male.  HPI      Used [place] interpreter  Since Friday evening has had dizziness, headache, nausea, vomiting, epigastric pain Currently having dizziness If lay down and stay still will feel ok, but when trying to sit up straight or stand up feels dizziness Constant chest pressure also since Friday, reports pressure low chest/epigastric region, pain in the "pit of my stomach" "in the pit of my chest"  Feels like eats food and it stays in the pit of his stomach. Able to swallow and tolerate po.  When lays down will regurtitate Denies numbness, weakness, difficulty talking or walking, visual changes or facial droop.    HA 5/10, taking pain medication every 5 hours so not sure if that is helping, 4/10   Past Medical History:  Diagnosis Date  . Ear pain    sees ENT  . Hypertension     Patient Active Problem List   Diagnosis Date Noted  . Acute low back pain 03/07/2017  . Hyperlipidemia 03/07/2017  . Hypertension 04/17/2015    Past Surgical History:  Procedure Laterality Date  . NO PAST SURGERIES         Family History  Problem Relation Age of Onset  . Heart disease Father 69       MI  . Heart disease Brother        vulvular disease  . Hypertension Brother   . Other Mother        tetanus  . Cancer Neg Hx   . Diabetes Neg Hx     Social History   Tobacco Use  . Smoking status: Former Games developer  . Smokeless tobacco: Never Used  . Tobacco comment: [age] yo, 1ppd  Vaping Use  . Vaping Use: Never used  Substance Use Topics  . Alcohol use: Yes    Alcohol/week: 0.0 standard drinks    Comment: 3-4 x/week  . Drug use: No    Home Medications Prior to Admission medications   Medication Sig Start Date End Date Taking? Authorizing Provider  omeprazole (PRILOSEC)  20 MG capsule Take 1 capsule (20 mg total) by mouth daily as needed. 06/24/19  Yes Koberlein, Paris Lore, MD  olmesartan (BENICAR) 20 MG tablet Take 0.5 tablets (10 mg total) by mouth 2 (two) times daily. 08/22/19   Koberlein, Paris Lore, MD  ondansetron (ZOFRAN) 4 MG tablet Take 1 tablet (4 mg total) by mouth every 6 (six) hours. 02/07/21   Alvira Monday, MD  pantoprazole (PROTONIX) 20 MG tablet Take 2 tablets (40 mg total) by mouth daily for 14 days. 02/07/21 02/21/21  Alvira Monday, MD    Allergies    Patient has no known allergies.  Review of Systems   Review of Systems  Constitutional: Positive for fatigue.  HENT: Negative for congestion and sore throat.   Eyes: Negative for visual disturbance.  Respiratory: Negative for cough and shortness of breath.   Cardiovascular: Positive for chest pain (pressure).  Gastrointestinal: Positive for abdominal pain (because of nausea), constipation (since Friday  ), nausea and vomiting. Negative for diarrhea.  Genitourinary: Negative for dysuria.  Skin: Negative for rash.  Neurological: Positive for light-headedness and headaches. Negative for dizziness, syncope, facial asymmetry, speech difficulty, weakness and numbness.    Physical Exam Updated Vital Signs  BP 112/88   Pulse 76   Temp 98.2 F (36.8 C) (Oral)   Resp 13   Ht 5\' 7"  (1.702 m)   Wt 74.8 kg   SpO2 99%   BMI 25.84 kg/m   Physical Exam Vitals and nursing note reviewed.  Constitutional:      General: He is not in acute distress.    Appearance: Normal appearance. He is well-developed. He is not ill-appearing or diaphoretic.  HENT:     Head: Normocephalic and atraumatic.  Eyes:     General: No visual field deficit.    Extraocular Movements: Extraocular movements intact.     Conjunctiva/sclera: Conjunctivae normal.     Pupils: Pupils are equal, round, and reactive to light.  Cardiovascular:     Rate and Rhythm: Normal rate and regular rhythm.     Pulses: Normal pulses.      Heart sounds: Normal heart sounds. No murmur heard. No friction rub. No gallop.   Pulmonary:     Effort: Pulmonary effort is normal. No respiratory distress.     Breath sounds: Normal breath sounds. No wheezing or rales.  Abdominal:     General: There is no distension.     Palpations: Abdomen is soft.     Tenderness: There is no abdominal tenderness. There is no guarding.  Musculoskeletal:        General: No swelling or tenderness.     Cervical back: Normal range of motion.  Skin:    General: Skin is warm and dry.     Findings: No erythema or rash.  Neurological:     General: No focal deficit present.     Mental Status: He is alert and oriented to person, place, and time.     GCS: GCS eye subscore is 4. GCS verbal subscore is 5. GCS motor subscore is 6.     Cranial Nerves: No cranial nerve deficit, dysarthria or facial asymmetry.     Sensory: No sensory deficit.     Motor: No weakness or tremor.     Coordination: Coordination normal. Finger-Nose-Finger Test normal.     Gait: Gait normal.     ED Results / Procedures / Treatments   Labs (all labs ordered are listed, but only abnormal results are displayed) Labs Reviewed  URINALYSIS, ROUTINE W REFLEX MICROSCOPIC - Abnormal; Notable for the following components:      Result Value   Ketones, ur 15 (*)    All other components within normal limits  RESP PANEL BY RT-PCR (FLU A&B, COVID) ARPGX2  CBC WITH DIFFERENTIAL/PLATELET  COMPREHENSIVE METABOLIC PANEL  LIPASE, BLOOD  TROPONIN I (HIGH SENSITIVITY)    EKG EKG Interpretation  Date/Time:  Sunday Feb 07 2021 19:32:25 EDT Ventricular Rate:  71 PR Interval:  179 QRS Duration: 102 QT Interval:  415 QTC Calculation: 451 R Axis:   110 Text Interpretation: Sinus rhythm Probable inferior infarct, old Lateral leads are also involved No previous ECGs available Confirmed by [id number] (Alvira Monday) on 02/07/2021 8:54:43 PM   Radiology DG Chest 2 View  Result Date:  02/07/2021 CLINICAL DATA:  Chest pain EXAM: CHEST - 2 VIEW COMPARISON:  None. FINDINGS: The heart size and mediastinal contours are within normal limits. Both lungs are clear. The visualized skeletal structures are unremarkable. IMPRESSION: No active cardiopulmonary disease. Electronically Signed   By: 02/09/2021 MD   On: 02/07/2021 20:51   CT Head Wo Contrast  Result Date: 02/07/2021 CLINICAL DATA:  Headache, malaise EXAM: CT HEAD WITHOUT CONTRAST  TECHNIQUE: Contiguous axial images were obtained from the base of the skull through the vertex without intravenous contrast. COMPARISON:  None. FINDINGS: Brain: Normal anatomic configuration. Probable dilated perivascular spaces within the a inferior basal ganglia bilaterally. No abnormal intra or extra-axial mass lesion or fluid collection. No abnormal mass effect or midline shift. No evidence of acute intracranial hemorrhage or infarct. Ventricular size is normal. Cerebellum unremarkable. Vascular: Unremarkable Skull: Intact Sinuses/Orbits: Paranasal sinuses are clear. Orbits are unremarkable. Other: Mastoid air cells and middle ear cavities are clear. IMPRESSION: No acute intracranial abnormality.  Normal examination. Electronically Signed   By: Helyn Numbers MD   On: 02/07/2021 20:50    Procedures Procedures   Medications Ordered in ED Medications  sodium chloride 0.9 % bolus 1,000 mL (0 mLs Intravenous Stopped 02/07/21 2306)    ED Course  I have reviewed the triage vital signs and the nursing notes.  Pertinent labs & imaging results that were available during my care of the patient were reviewed by me and considered in my medical decision making (see chart for details).    MDM Rules/Calculators/A&P                          65yo male with history of hypertension and hyperlipidemia presents with concern for fatigue, headache, nausea, vomiting, lightheadedness, chest pressure/epigastric pain.   CT head completed given new headche,n/v  dizziness, shows no sign of intracranial hemorrhage.  Neurologic exam normal, and have low suspicion for CVA.  Describes more lightheadedness than vertigo symptoms.  EKG without arrhythmia or ischemia. Troponin negative after several days of symptoms and doubt ACS. No sign of abnormality on CXR.  Labs show no evidence of pancreatitis, hepatitis, UTI or pyonephritis.  Do not feel history and exam are consistent with an aortic dissection.  Does not have tenderness to suggest cholecystitis.  Do not feel history and exam are consistent with food impaction or SBO.  Suspect possible viral syndrome, or gastritis causing pain, nausea and vomiting, resulting in dehydration with lightheadedness and headache.  Feels improved in the emergency department and given IV fluids for rehydration.  Given prescription for Protonix and Zofran.  Recommend follow-up with PCP/gastroenterology.   Final Clinical Impression(s) / ED Diagnoses Final diagnoses:  Acute gastritis without hemorrhage, unspecified gastritis type  Epigastric pain  Chest pain, unspecified type  Gastroesophageal reflux disease, unspecified whether esophagitis present  Lightheadedness  Acute nonintractable headache, unspecified headache type    Rx / DC Orders ED Discharge Orders         Ordered    ondansetron (ZOFRAN) 4 MG tablet  Every 6 hours,   Status:  Discontinued        02/07/21 2206    pantoprazole (PROTONIX) 20 MG tablet  Daily,   Status:  Discontinued        02/07/21 2206    ondansetron (ZOFRAN) 4 MG tablet  Every 6 hours        02/07/21 2318    pantoprazole (PROTONIX) 20 MG tablet  Daily        02/07/21 2318           Alvira Monday, MD 02/09/21 1230

## 2021-07-11 ENCOUNTER — Encounter: Payer: Self-pay | Admitting: *Deleted

## 2021-07-11 NOTE — Congregational Nurse Program (Signed)
  Dept: [phone]   Congregational Nurse Program Note  Date of Encounter: 07/11/2021  Past Medical History: Past Medical History:  Diagnosis Date   Ear pain    sees ENT   Hypertension     Encounter Details:  CNP Questionnaire - 07/11/21 1100       Questionnaire   Do you give verbal consent to treat you today? Yes    Location Patient Served  Not Applicable   [facility]   Visit Setting Church or Organization    Patient Status Immigrant    Engineer, building services or [employer] Referral N/A    Medication N/A    Medical Provider Yes    Screening Referrals N/A    Medical Referral N/A    Medical Appointment Made N/A    Food N/A    Transportation N/A    Housing/Utilities N/A    Interpersonal Safety N/A    Intervention Blood pressure;Spiritual Care    ED Visit Averted N/A    Life-Saving Intervention Made N/A            stopped by CN office , wanted check BP.  BP was 150/109. Advised check BP regularly . No other c/o voiced. Will f/u.

## 2022-03-02 IMAGING — DX DG CHEST 2V
2 series · 2 of 2 positions shown · non-contrast
Comparison: None.

CLINICAL DATA: Chest pain

EXAM:
CHEST - 2 VIEW

[chest pa]
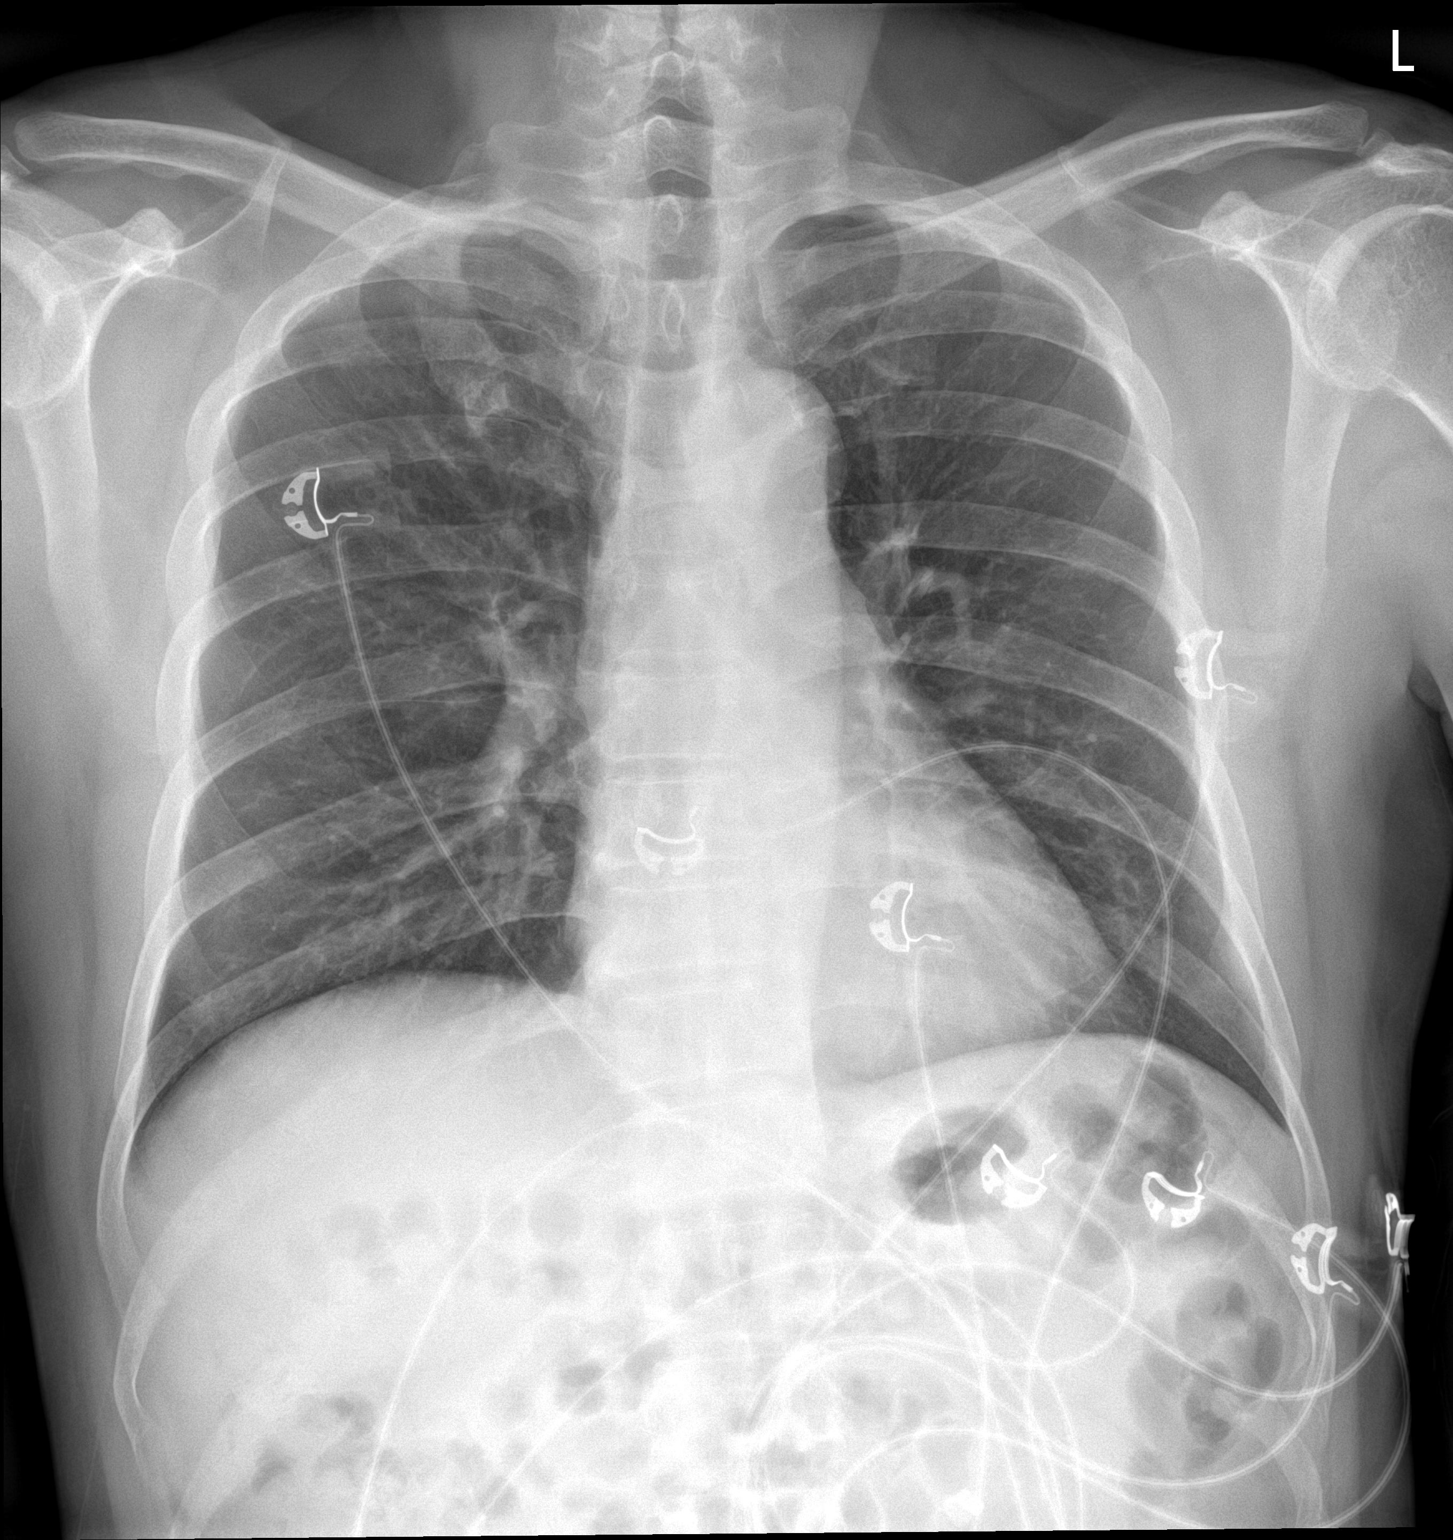

[chest lat]
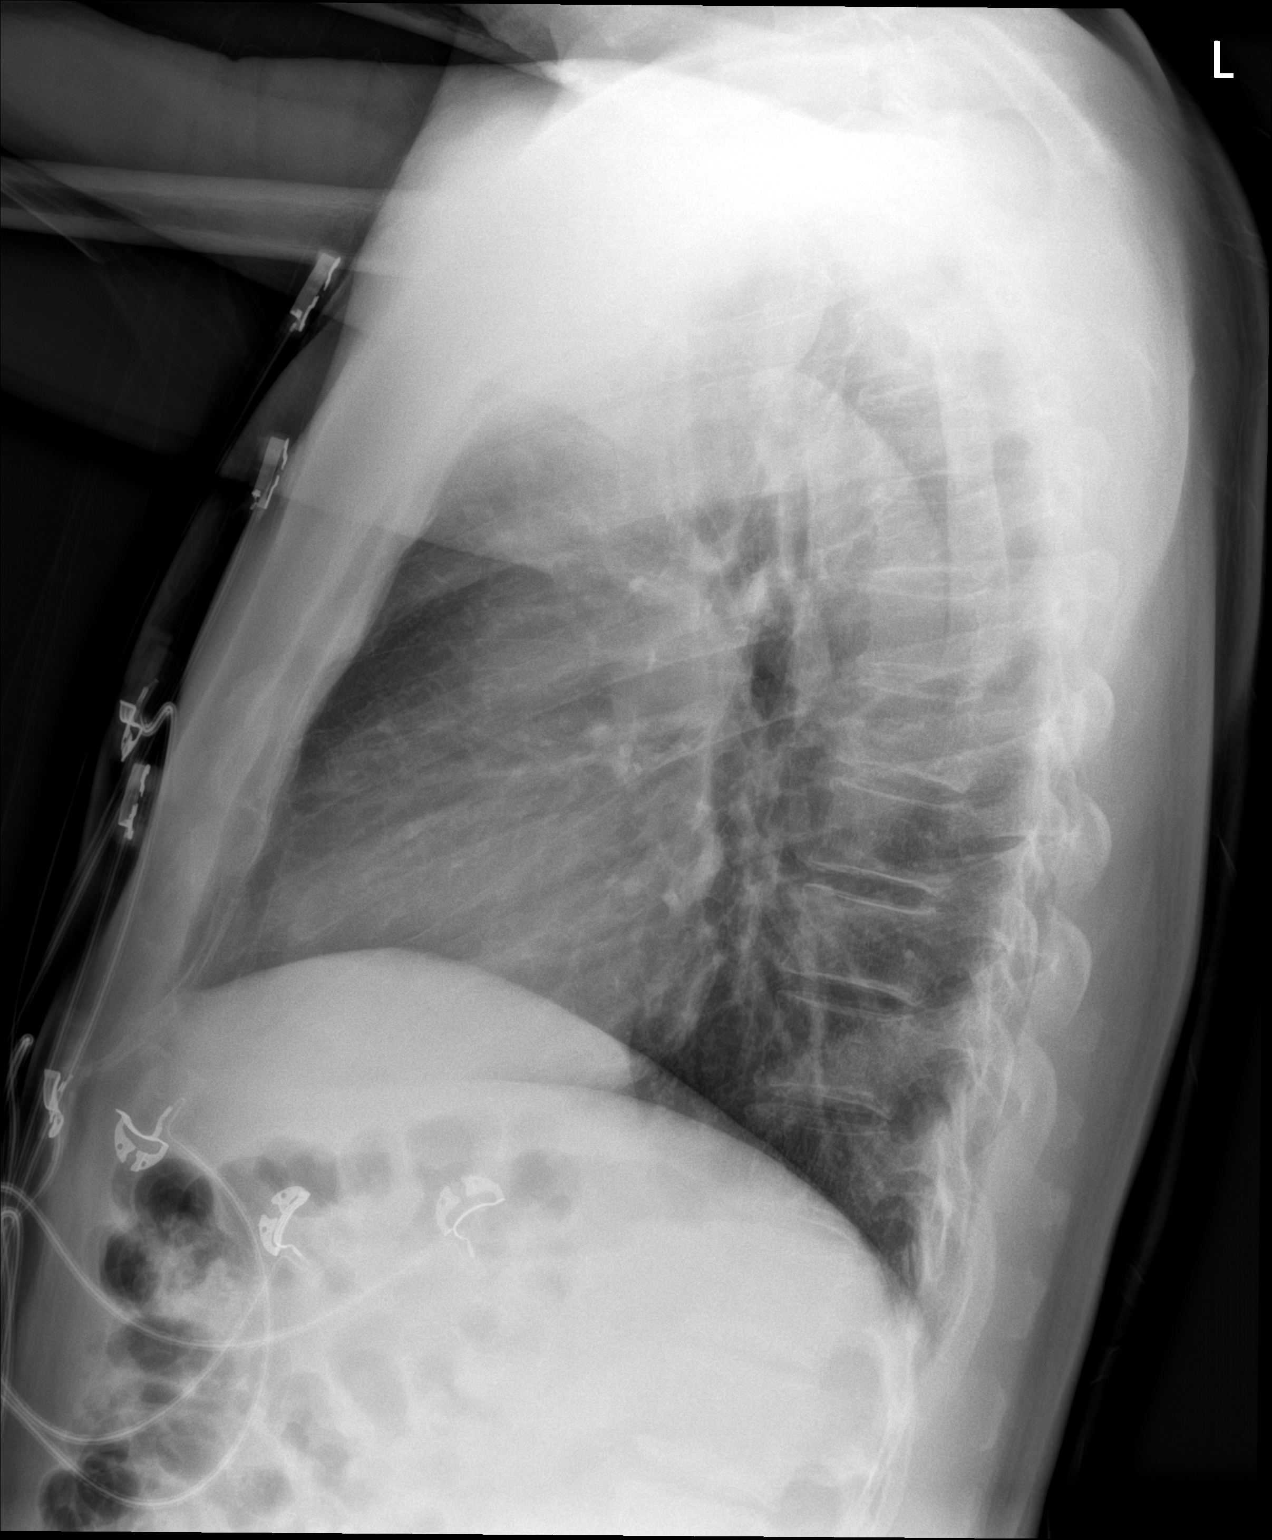

[2 of 2 positions shown; findings below may reference images not displayed]

FINDINGS: The heart size and mediastinal contours are within normal limits.
Both lungs are clear. The visualized skeletal structures are
unremarkable.
IMPRESSION: No active cardiopulmonary disease.

## 2022-04-10 DIAGNOSIS — B029 Zoster without complications: Secondary | ICD-10-CM | POA: Diagnosis not present

## 2022-07-29 DIAGNOSIS — I10 Essential (primary) hypertension: Secondary | ICD-10-CM | POA: Diagnosis not present

## 2022-07-29 DIAGNOSIS — R7303 Prediabetes: Secondary | ICD-10-CM | POA: Diagnosis not present

## 2022-07-29 DIAGNOSIS — E559 Vitamin D deficiency, unspecified: Secondary | ICD-10-CM | POA: Diagnosis not present

## 2022-07-29 DIAGNOSIS — Z13220 Encounter for screening for lipoid disorders: Secondary | ICD-10-CM | POA: Diagnosis not present

## 2022-08-24 DIAGNOSIS — I10 Essential (primary) hypertension: Secondary | ICD-10-CM | POA: Diagnosis not present

## 2022-08-24 DIAGNOSIS — R7303 Prediabetes: Secondary | ICD-10-CM | POA: Diagnosis not present

## 2022-08-24 DIAGNOSIS — E559 Vitamin D deficiency, unspecified: Secondary | ICD-10-CM | POA: Diagnosis not present

## 2022-08-24 DIAGNOSIS — Z0000 Encounter for general adult medical examination without abnormal findings: Secondary | ICD-10-CM | POA: Diagnosis not present

## 2022-08-24 DIAGNOSIS — Z23 Encounter for immunization: Secondary | ICD-10-CM | POA: Diagnosis not present

## 2022-12-25 DIAGNOSIS — R509 Fever, unspecified: Secondary | ICD-10-CM | POA: Diagnosis not present

## 2022-12-25 DIAGNOSIS — R109 Unspecified abdominal pain: Secondary | ICD-10-CM | POA: Diagnosis not present

## 2022-12-25 DIAGNOSIS — Z20822 Contact with and (suspected) exposure to covid-19: Secondary | ICD-10-CM | POA: Diagnosis not present

## 2022-12-25 DIAGNOSIS — R10816 Epigastric abdominal tenderness: Secondary | ICD-10-CM | POA: Diagnosis not present

## 2023-08-31 DIAGNOSIS — Z13220 Encounter for screening for lipoid disorders: Secondary | ICD-10-CM | POA: Diagnosis not present

## 2023-08-31 DIAGNOSIS — Z125 Encounter for screening for malignant neoplasm of prostate: Secondary | ICD-10-CM | POA: Diagnosis not present

## 2023-08-31 DIAGNOSIS — I10 Essential (primary) hypertension: Secondary | ICD-10-CM | POA: Diagnosis not present

## 2023-08-31 DIAGNOSIS — R7303 Prediabetes: Secondary | ICD-10-CM | POA: Diagnosis not present

## 2023-08-31 DIAGNOSIS — K219 Gastro-esophageal reflux disease without esophagitis: Secondary | ICD-10-CM | POA: Diagnosis not present

## 2023-08-31 DIAGNOSIS — Z0000 Encounter for general adult medical examination without abnormal findings: Secondary | ICD-10-CM | POA: Diagnosis not present

## 2023-08-31 DIAGNOSIS — E559 Vitamin D deficiency, unspecified: Secondary | ICD-10-CM | POA: Diagnosis not present

## 2023-10-08 DIAGNOSIS — M461 Sacroiliitis, not elsewhere classified: Secondary | ICD-10-CM | POA: Diagnosis not present

## 2023-10-08 DIAGNOSIS — M5442 Lumbago with sciatica, left side: Secondary | ICD-10-CM | POA: Diagnosis not present

## 2023-10-08 DIAGNOSIS — M5432 Sciatica, left side: Secondary | ICD-10-CM | POA: Diagnosis not present

## 2023-10-08 DIAGNOSIS — M47817 Spondylosis without myelopathy or radiculopathy, lumbosacral region: Secondary | ICD-10-CM | POA: Diagnosis not present

## 2023-10-08 DIAGNOSIS — M4317 Spondylolisthesis, lumbosacral region: Secondary | ICD-10-CM | POA: Diagnosis not present

## 2023-10-08 DIAGNOSIS — M51371 Other intervertebral disc degeneration, lumbosacral region with lower extremity pain only: Secondary | ICD-10-CM | POA: Diagnosis not present

## 2023-10-08 DIAGNOSIS — N200 Calculus of kidney: Secondary | ICD-10-CM | POA: Diagnosis not present

## 2023-10-17 DIAGNOSIS — N200 Calculus of kidney: Secondary | ICD-10-CM | POA: Diagnosis not present

## 2023-11-22 DIAGNOSIS — M47816 Spondylosis without myelopathy or radiculopathy, lumbar region: Secondary | ICD-10-CM | POA: Diagnosis not present

## 2023-12-07 DIAGNOSIS — N200 Calculus of kidney: Secondary | ICD-10-CM | POA: Diagnosis not present

## 2024-02-27 DIAGNOSIS — B354 Tinea corporis: Secondary | ICD-10-CM | POA: Diagnosis not present

## 2024-02-29 DIAGNOSIS — B354 Tinea corporis: Secondary | ICD-10-CM | POA: Diagnosis not present

## 2024-03-05 DIAGNOSIS — R7303 Prediabetes: Secondary | ICD-10-CM | POA: Diagnosis not present

## 2024-03-05 DIAGNOSIS — I10 Essential (primary) hypertension: Secondary | ICD-10-CM | POA: Diagnosis not present

## 2024-03-05 DIAGNOSIS — R21 Rash and other nonspecific skin eruption: Secondary | ICD-10-CM | POA: Diagnosis not present

## 2024-03-05 DIAGNOSIS — Z1330 Encounter for screening examination for mental health and behavioral disorders, unspecified: Secondary | ICD-10-CM | POA: Diagnosis not present

## 2024-03-05 DIAGNOSIS — Z13220 Encounter for screening for lipoid disorders: Secondary | ICD-10-CM | POA: Diagnosis not present

## 2024-03-07 DIAGNOSIS — B354 Tinea corporis: Secondary | ICD-10-CM | POA: Diagnosis not present

## 2024-03-07 DIAGNOSIS — L259 Unspecified contact dermatitis, unspecified cause: Secondary | ICD-10-CM | POA: Diagnosis not present
